# Patient Record
Sex: Female | Born: 1935 | Race: White | Hispanic: No | Smoking: Former smoker
Health system: Southern US, Community
[De-identification: ages and names within clinical notes are randomized; demographics above are authoritative.]

## PROBLEM LIST (undated history)

## (undated) DIAGNOSIS — F0390 Unspecified dementia without behavioral disturbance: Secondary | ICD-10-CM

## (undated) DIAGNOSIS — E079 Disorder of thyroid, unspecified: Secondary | ICD-10-CM

## (undated) DIAGNOSIS — M1990 Unspecified osteoarthritis, unspecified site: Secondary | ICD-10-CM

## (undated) DIAGNOSIS — I10 Essential (primary) hypertension: Secondary | ICD-10-CM

## (undated) DIAGNOSIS — N289 Disorder of kidney and ureter, unspecified: Secondary | ICD-10-CM

## (undated) HISTORY — PX: CYST REMOVAL NECK: SHX6281

## (undated) HISTORY — PX: TOTAL ABDOMINAL HYSTERECTOMY: SHX209

## (undated) HISTORY — PX: CHOLECYSTECTOMY: SHX55

---

## 2010-09-30 ENCOUNTER — Emergency Department (HOSPITAL_BASED_OUTPATIENT_CLINIC_OR_DEPARTMENT_OTHER)
Admission: EM | Admit: 2010-09-30 | Discharge: 2010-09-30 | Disposition: A | Discharge status: Other Acute Inpatient Hospital | Payer: Self-pay | Source: Home / Self Care | Admitting: Emergency Medicine

## 2010-09-30 ENCOUNTER — Inpatient Hospital Stay (HOSPITAL_COMMUNITY)
Admission: EM | Admit: 2010-09-30 | Discharge: 2010-10-03 | Payer: Self-pay | Attending: Internal Medicine | Admitting: Internal Medicine

## 2010-10-05 LAB — CBC
HCT: 38.5 % (ref 36.0–46.0)
HCT: 39.1 % (ref 36.0–46.0)
HCT: 34.7 % — ABNORMAL LOW (ref 36.0–46.0)
Hemoglobin: 13.8 g/dL (ref 12.0–15.0)
Hemoglobin: 13.7 g/dL (ref 12.0–15.0)
Hemoglobin: 12.1 g/dL (ref 12.0–15.0)
MCH: 32 pg (ref 26.0–34.0)
MCH: 32.9 pg (ref 26.0–34.0)
MCH: 32.4 pg (ref 26.0–34.0)
MCHC: 35.8 g/dL (ref 30.0–36.0)
MCHC: 35 g/dL (ref 30.0–36.0)
MCHC: 34.9 g/dL (ref 30.0–36.0)
MCV: 89.3 fL (ref 78.0–100.0)
MCV: 93.8 fL (ref 78.0–100.0)
MCV: 93 fL (ref 78.0–100.0)
Platelets: 226 10*3/uL (ref 150–400)
Platelets: 198 10*3/uL (ref 150–400)
Platelets: 227 10*3/uL (ref 150–400)
RBC: 4.31 MIL/uL (ref 3.87–5.11)
RBC: 4.17 MIL/uL (ref 3.87–5.11)
RBC: 3.73 MIL/uL — ABNORMAL LOW (ref 3.87–5.11)
RDW: 12 % (ref 11.5–15.5)
RDW: 12.7 % (ref 11.5–15.5)
RDW: 12.4 % (ref 11.5–15.5)
WBC: 19.7 10*3/uL — ABNORMAL HIGH (ref 4.0–10.5)
WBC: 20.3 10*3/uL — ABNORMAL HIGH (ref 4.0–10.5)
WBC: 15 10*3/uL — ABNORMAL HIGH (ref 4.0–10.5)

## 2010-10-05 LAB — COMPREHENSIVE METABOLIC PANEL
ALT: 15 U/L (ref 0–35)
ALT: 12 U/L (ref 0–35)
AST: 24 U/L (ref 0–37)
AST: 26 U/L (ref 0–37)
Albumin: 3.9 g/dL (ref 3.5–5.2)
Albumin: 3.2 g/dL — ABNORMAL LOW (ref 3.5–5.2)
Alkaline Phosphatase: 84 U/L (ref 39–117)
Alkaline Phosphatase: 56 U/L (ref 39–117)
BUN: 13 mg/dL (ref 6–23)
BUN: 10 mg/dL (ref 6–23)
CO2: 22 mEq/L (ref 19–32)
CO2: 24 mEq/L (ref 19–32)
Calcium: 9.1 mg/dL (ref 8.4–10.5)
Calcium: 8.4 mg/dL (ref 8.4–10.5)
Chloride: 104 mEq/L (ref 96–112)
Chloride: 103 mEq/L (ref 96–112)
Creatinine, Ser: 0.7 mg/dL (ref 0.4–1.2)
Creatinine, Ser: 0.76 mg/dL (ref 0.4–1.2)
GFR calc Af Amer: >60 mL/min (ref >60)
GFR calc Af Amer: >60 mL/min (ref >60)
GFR calc non Af Amer: >60 mL/min (ref >60)
GFR calc non Af Amer: >60 mL/min (ref >60)
Glucose, Bld: 96 mg/dL (ref 70–99)
Glucose, Bld: 115 mg/dL — ABNORMAL HIGH (ref 70–99)
Potassium: 3.2 mEq/L — ABNORMAL LOW (ref 3.5–5.1)
Potassium: 3.2 mEq/L — ABNORMAL LOW (ref 3.5–5.1)
Sodium: 140 mEq/L (ref 135–145)
Sodium: 134 mEq/L — ABNORMAL LOW (ref 135–145)
Total Bilirubin: 1 mg/dL (ref 0.3–1.2)
Total Bilirubin: 0.7 mg/dL (ref 0.3–1.2)
Total Protein: 7 g/dL (ref 6.0–8.3)
Total Protein: 6.6 g/dL (ref 6.0–8.3)

## 2010-10-05 LAB — URINALYSIS, ROUTINE W REFLEX MICROSCOPIC
Ketones, ur: >80 mg/dL — AB
Leukocytes, UA: NEGATIVE
Nitrite: NEGATIVE
Protein, ur: 30 mg/dL — AB
Specific Gravity, Urine: 1.022 (ref 1.005–1.030)
Urine Glucose, Fasting: NEGATIVE mg/dL
Urobilinogen, UA: 0.2 mg/dL (ref 0.0–1.0)
pH: 6 (ref 5.0–8.0)

## 2010-10-05 LAB — URINE CULTURE
Colony Count: NO GROWTH
Culture  Setup Time: 201201120223
Culture: NO GROWTH

## 2010-10-05 LAB — BASIC METABOLIC PANEL
BUN: 5 mg/dL — ABNORMAL LOW (ref 6–23)
CO2: 23 mEq/L (ref 19–32)
Calcium: 8.2 mg/dL — ABNORMAL LOW (ref 8.4–10.5)
Chloride: 109 mEq/L (ref 96–112)
Creatinine, Ser: 0.65 mg/dL (ref 0.4–1.2)
GFR calc Af Amer: >60 mL/min (ref >60)
GFR calc non Af Amer: >60 mL/min (ref >60)
Glucose, Bld: 126 mg/dL — ABNORMAL HIGH (ref 70–99)
Potassium: 3.5 mEq/L (ref 3.5–5.1)
Sodium: 137 mEq/L (ref 135–145)

## 2010-10-05 LAB — LEGIONELLA ANTIGEN, URINE: Legionella Antigen, Urine: NEGATIVE

## 2010-10-05 LAB — DIFFERENTIAL
Basophils Absolute: 0 10*3/uL (ref 0.0–0.1)
Basophils Relative: 0 % (ref 0–1)
Eosinophils Absolute: 0.2 10*3/uL (ref 0.0–0.7)
Eosinophils Relative: 1 % (ref 0–5)
Lymphocytes Relative: 9 % — ABNORMAL LOW (ref 12–46)
Lymphs Abs: 1.7 10*3/uL (ref 0.7–4.0)
Monocytes Absolute: 1.7 10*3/uL — ABNORMAL HIGH (ref 0.1–1.0)
Monocytes Relative: 9 % (ref 3–12)
Neutro Abs: 16.1 10*3/uL — ABNORMAL HIGH (ref 1.7–7.7)
Neutrophils Relative %: 82 % — ABNORMAL HIGH (ref 43–77)

## 2010-10-05 LAB — PROTIME-INR
INR: 1.39 (ref 0.00–1.49)
Prothrombin Time: 17.3 seconds — ABNORMAL HIGH (ref 11.6–15.2)

## 2010-10-05 LAB — URINE MICROSCOPIC-ADD ON

## 2010-10-07 LAB — CULTURE, BLOOD (ROUTINE X 2)
Culture  Setup Time: 201201112238
Culture: NO GROWTH

## 2010-10-12 LAB — CULTURE, BLOOD (ROUTINE X 2)
Culture  Setup Time: 201201120200
Culture  Setup Time: 201201120321
Culture  Setup Time: 201201120321
Culture: NO GROWTH
Culture: NO GROWTH
Culture: NO GROWTH

## 2010-10-22 NOTE — Discharge Summary (Signed)
NAME:  [name] Davidson, [name] Davidson             ACCOUNT NO.:  [id number]  MEDICAL RECORD NO.:  [id number]          PATIENT TYPE:  INP  LOCATION:  4736                         FACILITY:  [facility]  PHYSICIAN:  Ladell Pier, M.D.   DATE OF BIRTH:  13-Nov-1921  DATE OF ADMISSION:  09/30/2010 DATE OF DISCHARGE:  10/02/2010                              DISCHARGE SUMMARY   DISCHARGE DIAGNOSES: 1. Community-acquired pneumonia. 2. Hypertension. 3. Hypothyroidism. 4. Depression. 5. Dementia.  DISCHARGE MEDICATIONS: 1. Doxycycline 100 mg b.i.d. x10 days. 2. Guaifenesin 5 mL q.4 p.r.n. 3. Amlodipine 5 mg daily. 4. Levothyroxine 50 mcg daily. 5. Lexapro 10 mg daily. 6. Metoprolol 25 mg daily. 7. Mirtazapine 30 mg at bedtime.  FOLLOWUP APPOINTMENTS:  The patient to follow up with Dr. Andrey Campanile in 1week.  PROCEDURES:  Chest x-ray done on January 12th shows increased right lower lobe lung infiltrate consistent with pneumonia.  CONSULTANTS:  None.HISTORY OF PRESENT ILLNESS:  The patient is an 77 year old white female with history of dementia, hypertension, hypothyroidism, and depression came to the hospital with complaints of fever, feeling weak, diarrhea, and nausea for the past 2-3 days.  The patient is a poor historian, but her daughter, [name] Davidson, supplied most of the information.  Her daughter states that the patient has not been feeling well for the past 2 days. She has not been eating and she has been quite lethargic.  Please see admission note for remainder of history, past medical history, family history, social history, meds, allergies, and review of systems per admission H and P.  PHYSICAL EXAMINATION AT TIME TO DISCHARGE:  VITAL SIGNS:  Temperature 99, pulse of 90, respirations 20, blood pressure 143/86, and pulse oximetry 96% on 2 liters and 91% on room air. GENERAL:  The patient is sitting up in chair, well-nourished white female. HEENT:  Normocephalic and atraumatic.  Pupils reactive to  light.  Throat without erythema. CARDIOVASCULAR:  Regular rate and rhythm. LUNGS:  Decreased breath sounds at the bases, especially on the right. ABDOMEN:  Positive bowel sounds. EXTREMITIES:  No edema.  HOSPITAL COURSE: 1. Community-acquired pneumonia:  The patient was admitted to the     hospital, placed on IV Rocephin and Zithromax.  After that, she was     transitioned to doxycycline p.o. as she is allergic to Levaquin,     did not put her on Avelox.  She developed a rash with this     antibiotic.  Discussed with her daughter.  The patient said she     feels fine.  Her breathing is fine.  She does have some pain in the     rib area when she takes a deep breath.  Discussed with them using     Tylenol p.r.n. for the pain.  Continue with the antibiotic and     follow up with PCP.  The patient discharged with home health PT,     OT, and RN. 2. Hypertension:  Blood pressure remained stable throughout     hospitalization. 3. Hypothyroidism.  She was continued on her home thyroid medications.  LABORATORY DATA:  At the time of discharge, urine for Legionella negative.  Blood cultures negative x2.  Sodium 137, potassium 3.5, chloride 109, CO2 of 22, glucose 126, BUN 5, and creatinine 0.65.  WBC is 15, hemoglobin 12.1, platelet of 227, and MCV 93.  Time spent with the patient and doing this discharge is approximately 35 minutes.     Ladell Pier, M.D.     [place]/MEDQ  D:  10/02/2010  T:  10/03/2010  Job:  [id number]  cc:   Dr. Andrey Campanile  Electronically Signed by Ladell Pier M.D. on 10/22/2010 01:37:33 PM

## 2010-10-22 NOTE — Discharge Summary (Signed)
  NAME:  [name] Davidson, [name] Davidson             ACCOUNT NO.:  [id number]  MEDICAL RECORD NO.:  [id number]          PATIENT TYPE:  INP  LOCATION:  4736                         FACILITY:  [facility]  PHYSICIAN:  Ladell Pier, M.D.   DATE OF BIRTH:  [birth year]  DATE OF ADMISSION:  09/30/2010 DATE OF DISCHARGE:  10/03/2010                              DISCHARGE SUMMARY   ADDENDUM  The patient was actually discharged on the 14th.  She was originally discharged on the 13th, but secondary to transportation issues, she was not able to go until the 14th.  The patient actually fell also, the afternoon of the 13th.  They witnessed her fall and she did not hit her head.  She fell on her bottom.  She had no pain, no bruises.  This was discussed with her grandson.  The patient is being discharged today and will follow up with PCP next week, Dr. Andrey Campanile.     Ladell Pier, M.D.     [place]/MEDQ  D:  10/03/2010  T:  10/04/2010  Job:  [id number]  cc:   Dr. Andrey Campanile  Electronically Signed by Ladell Pier M.D. on 10/22/2010 01:37:38 PM

## 2011-04-19 ENCOUNTER — Encounter
Payer: Medicare Other | Attending: Physical Medicine and Rehabilitation | Admitting: Physical Medicine and Rehabilitation

## 2011-04-19 DIAGNOSIS — M549 Dorsalgia, unspecified: Secondary | ICD-10-CM

## 2011-04-19 DIAGNOSIS — M1710 Unilateral primary osteoarthritis, unspecified knee: Secondary | ICD-10-CM

## 2011-04-19 DIAGNOSIS — M810 Age-related osteoporosis without current pathological fracture: Secondary | ICD-10-CM | POA: Insufficient documentation

## 2011-04-19 DIAGNOSIS — M545 Low back pain, unspecified: Secondary | ICD-10-CM | POA: Insufficient documentation

## 2011-04-19 DIAGNOSIS — G894 Chronic pain syndrome: Secondary | ICD-10-CM

## 2011-04-19 DIAGNOSIS — M546 Pain in thoracic spine: Secondary | ICD-10-CM

## 2011-04-19 DIAGNOSIS — M79609 Pain in unspecified limb: Secondary | ICD-10-CM | POA: Insufficient documentation

## 2011-04-20 NOTE — Progress Notes (Signed)
Ms. Kersti Scavone is an 77 year old woman who is accompanied by her daughter Carlynn Purl.  Ms. Archie Patten is in the room with the permission of the patient.  Ms. Mclamb has a history of some memory problems and requests that her daughter help her with her history.  Ms. Zavada has a long history of chronic low back pain as well as bilateral leg pain.  She was referred kindly by Dr. Otelia Sergeant.  She has known rather severe osteoporosis and Alzheimer disease.  Ms. Archie Patten had her mother begin living with her about a year ago.  She has seen multiple physicians for her chronic pain complaints, and she has had compression fractures in the past and has undergone vertebroplasty at L1 at an outside institution.  Her average pain is about 9 on a scale of 10.  Pain is worse during the day, improves at night when she is in bed, although she does spend quite a bit of time in the bed during the day.  Her pain is worse when she is up and trying to walk.  Sleep is fair.  Pain improves somewhat with medication.  She tells me that the hydrocodone and gabapentin take the edge off of the pain somewhat.  She also occasionally feels some pins and needles in her heels when she is lying down.  FUNCTIONAL STATUS:  She can walk not more than 10 minutes.  She does not climb stairs nor drive.  She requires some assistance with bathing.  She is independent with toileting and dressing, feeding, needs help with meal prep and household duties.  She has had some problems with urinary tract infections, otherwise is continent.  She has a bedside commode, and she admits to some weakness in the lower extremities and confusion.  Denies suicidal ideation.  Also review of systems positive for constipation and coughing.  She follows up with primary care doctor for this.  PAST MEDICAL HISTORY:  Positive for a history of thyroid problems, hypertension, kidney disease.  SURGICAL HISTORY:  Positive for hysterectomy, neck cyst, and  the vertebroplasty.  The patient is widowed, lives with her daughter, does not smoke or drink alcohol.  FAMILY HISTORY:  Positive for heart disease, blood pressure, and hypertension.  PHYSICAL EXAMINATION:  Her blood pressure is 149/63, pulse 69, respiration 18, 94% saturated on room air.  She is a frail elderly female who appears her stated age.  She is oriented to person and place and month.  Her affect is overall bright.  She is cooperative, pleasant.  She answers questions and follows commands without difficulty.  Cranial nerves and coordination are grossly intact.  Reflexes in the upper extremities are 1+ and symmetric, diminished in the lower extremities. No abnormal tone, clonus, or tremors are noted.  She has negative Hoffman sign bilaterally.  She reports intact sensation with respect to light touch and pinprick in upper and lower extremities.  Her motor strength is generally in the 4+/5 range without obvious focal deficit with the exception of hip flexors.  She also had some difficulty exiting a chair suggesting some hip extensor weakness.  Her gait is slow.  She has some difficulty with tandem gait, but is able to perform an adequate Romberg test.  She has limitations in cervical range of motion in all planes, some mild shoulder limitations.  Lumbar motion is limited in all planes.  She has bilateral knee pain with flexion/extension.  No joint effusion is appreciated.  Some joint line tenderness is noted bilaterally.  IMPRESSION:  1. Chronic low back pain with history of severe osteoporosis with     known compression fractures status post vertebroplasty at L1 in the     past. 2. History of multiple endplate fractures at L2, L3, and L4. 3. Bilateral knee osteoarthritis.  PLAN:  Reviewing at treatment options, the patient has trialed icy hot patches, Salonpas patches as well as Lidoderm patches.  She finds TENS unit somewhat helpful and is using that.  She finds the  hydrocodone as well as the gabapentin currently helpful.  There is some concern with the family regarding medications and making her drowsier or make her balance worse.  They are not interested in invasive options at this point.  We discussed possibly switching her 7.5 mg hydrocodone tablet to a 5 mg oxycodone tablet.  However, she understands that for these medications to be filled through the pain clinic she would need to be seen every month for pill counts.  They are not sure they want to come every month at this point for pain medications.  They are going to think about this treatment option.  At this point, I have answered all her questions.  They understand that this is a difficult problem to manage. I have suggested some physical therapy to address some hip weakness that was noted on exam today that would assist her in transfers going from sit to stand getting in and out of a car; however, the patient and her family declined this currently.  I will see her back in 4-6 weeks.  They are currently comfortable with current options.     Brantley Stage, M.D. Electronically Signed    DMK/MedQ D:  04/19/2011 14:49:12  T:  04/20/2011 00:38:42  Job #:  [id number]  cc:   Kerrin Champagne, M.D. Fax: [phone]

## 2011-05-28 ENCOUNTER — Ambulatory Visit: Payer: BC Managed Care – PPO | Admitting: Physical Medicine and Rehabilitation

## 2011-09-21 ENCOUNTER — Encounter: Payer: Self-pay | Admitting: Emergency Medicine

## 2011-09-21 ENCOUNTER — Emergency Department (HOSPITAL_BASED_OUTPATIENT_CLINIC_OR_DEPARTMENT_OTHER)
Admission: EM | Admit: 2011-09-21 | Discharge: 2011-09-22 | Disposition: A | Discharge status: Home / Self Care | Payer: Medicare Other | Attending: Emergency Medicine | Admitting: Emergency Medicine

## 2011-09-21 ENCOUNTER — Emergency Department (INDEPENDENT_AMBULATORY_CARE_PROVIDER_SITE_OTHER): Payer: Medicare Other

## 2011-09-21 DIAGNOSIS — M545 Low back pain, unspecified: Secondary | ICD-10-CM

## 2011-09-21 DIAGNOSIS — W06XXXA Fall from bed, initial encounter: Secondary | ICD-10-CM | POA: Insufficient documentation

## 2011-09-21 DIAGNOSIS — R079 Chest pain, unspecified: Secondary | ICD-10-CM

## 2011-09-21 DIAGNOSIS — R4182 Altered mental status, unspecified: Secondary | ICD-10-CM

## 2011-09-21 DIAGNOSIS — W19XXXA Unspecified fall, initial encounter: Secondary | ICD-10-CM

## 2011-09-21 DIAGNOSIS — E079 Disorder of thyroid, unspecified: Secondary | ICD-10-CM | POA: Insufficient documentation

## 2011-09-21 DIAGNOSIS — Y92009 Unspecified place in unspecified non-institutional (private) residence as the place of occurrence of the external cause: Secondary | ICD-10-CM | POA: Insufficient documentation

## 2011-09-21 DIAGNOSIS — M899 Disorder of bone, unspecified: Secondary | ICD-10-CM

## 2011-09-21 DIAGNOSIS — F0390 Unspecified dementia without behavioral disturbance: Secondary | ICD-10-CM | POA: Insufficient documentation

## 2011-09-21 DIAGNOSIS — Z79899 Other long term (current) drug therapy: Secondary | ICD-10-CM | POA: Insufficient documentation

## 2011-09-21 DIAGNOSIS — E860 Dehydration: Secondary | ICD-10-CM

## 2011-09-21 DIAGNOSIS — I10 Essential (primary) hypertension: Secondary | ICD-10-CM | POA: Insufficient documentation

## 2011-09-21 DIAGNOSIS — M949 Disorder of cartilage, unspecified: Secondary | ICD-10-CM

## 2011-09-21 DIAGNOSIS — R319 Hematuria, unspecified: Secondary | ICD-10-CM | POA: Insufficient documentation

## 2011-09-21 HISTORY — DX: Unspecified osteoarthritis, unspecified site: M19.90

## 2011-09-21 HISTORY — DX: Disorder of kidney and ureter, unspecified: N28.9

## 2011-09-21 HISTORY — DX: Essential (primary) hypertension: I10

## 2011-09-21 HISTORY — DX: Disorder of thyroid, unspecified: E07.9

## 2011-09-21 HISTORY — DX: Unspecified dementia, unspecified severity, without behavioral disturbance, psychotic disturbance, mood disturbance, and anxiety: F03.90

## 2011-09-21 LAB — COMPREHENSIVE METABOLIC PANEL
Albumin: 3.7 g/dL (ref 3.5–5.2)
CO2: 25 mEq/L (ref 19–32)
Chloride: 103 mEq/L (ref 96–112)
Creatinine, Ser: 0.6 mg/dL (ref 0.50–1.10)
GFR calc Af Amer: >90 mL/min (ref >90)
Potassium: 3.6 mEq/L (ref 3.5–5.1)

## 2011-09-21 LAB — DIFFERENTIAL
Basophils Absolute: 0 10*3/uL (ref 0.0–0.1)
Lymphocytes Relative: 23 % (ref 12–46)
Monocytes Absolute: 1 10*3/uL (ref 0.1–1.0)
Monocytes Relative: 15 % — ABNORMAL HIGH (ref 3–12)
Neutro Abs: 4.2 10*3/uL (ref 1.7–7.7)

## 2011-09-21 LAB — URINALYSIS, ROUTINE W REFLEX MICROSCOPIC
Bilirubin Urine: NEGATIVE
Nitrite: NEGATIVE
Specific Gravity, Urine: 1.021 (ref 1.005–1.030)
Urobilinogen, UA: 0.2 mg/dL (ref 0.0–1.0)
pH: 6 (ref 5.0–8.0)

## 2011-09-21 LAB — CBC
HCT: 41.5 % (ref 36.0–46.0)
Hemoglobin: 14.2 g/dL (ref 12.0–15.0)
RBC: 4.48 MIL/uL (ref 3.87–5.11)
WBC: 6.7 10*3/uL (ref 4.0–10.5)

## 2011-09-21 LAB — URINE MICROSCOPIC-ADD ON

## 2011-09-21 MED ORDER — SODIUM CHLORIDE 0.9 % IV BOLUS (SEPSIS)
1000.0000 mL | Freq: Once | INTRAVENOUS | Status: AC
  Administered 2011-09-22: 1000 mL via INTRAVENOUS

## 2011-09-21 NOTE — ED Provider Notes (Signed)
History     CSN: [id number]  Arrival date & time 09/21/11  1754   First MD Initiated Contact with Patient 09/21/11 1901      Chief Complaint  Patient presents with  . Flank Pain    (Consider location/radiation/quality/duration/timing/severity/associated sxs/prior treatment) HPI Comments: Patient is an 78 year old female with a history of hypertension, renal insufficiency, dementia who presents after a fall. She is currently living with her daughter and as she was trying to transition from the bed to the bedside commode her right knee which is known to be arthritic felt weak and she fell to the ground onto her bottom. This was acute in onset just prior to arrival and because the patient was unable to get up because of pain the family member called for EMS transport to the hospital. Currently the patient complains of no pain unless she is moving. When she moves she describes pain in her lower back and her right side in the lower back. She denies head injury, neck pain, extremity pain  Patient is a 78 y.o. female presenting with flank pain. The history is provided by the patient and a relative. The history is limited by the condition of the patient.  Flank Pain    Past Medical History  Diagnosis Date  . Dementia   . Osteoarthritis   . Hypertension   . Thyroid disease   . Renal disorder     No past surgical history on file.  No family history on file.  History  Substance Use Topics  . Smoking status: Not on file  . Smokeless tobacco: Not on file  . Alcohol Use: No    OB History    Grav Para Term Preterm Abortions TAB SAB Ect Mult Living                  Review of Systems  Unable to perform ROS: Dementia  Genitourinary: Positive for flank pain.    Allergies  Contrast media and Sulfa drugs cross reactors  Home Medications   Current Outpatient Rx  Name Route Sig Dispense Refill  . AMLODIPINE BESYLATE 5 MG PO TABS Oral Take 5 mg by mouth daily.      . ASPIRIN PO  Oral Take 1 tablet by mouth daily.      Marland Kitchen LEVOTHYROXINE SODIUM 50 MCG PO TABS Oral Take 50 mcg by mouth daily.      Marland Kitchen SYNTHROID PO Oral Take 1 tablet by mouth daily.      Marland Kitchen MEMANTINE HCL 10 MG PO TABS Oral Take 10 mg by mouth 2 (two) times daily.      Marland Kitchen METHOCARBAMOL 500 MG PO TABS Oral Take 500 mg by mouth every 6 (six) hours.      Marland Kitchen METOPROLOL SUCCINATE ER 25 MG PO TB24 Oral Take 25 mg by mouth daily.      Marland Kitchen MIRTAZAPINE 30 MG PO TABS Oral Take 30 mg by mouth at bedtime.      Marland Kitchen OVER THE COUNTER MEDICATION Oral Take 1 tablet by mouth daily. Protandim supplement     . NAPROXEN 500 MG PO TABS Oral Take 1 tablet (500 mg total) by mouth 2 (two) times daily with a meal. 30 tablet 0  . TRAMADOL HCL 50 MG PO TABS Oral Take 1 tablet (50 mg total) by mouth every 6 (six) hours as needed for pain. Maximum dose= 8 tablets per day 15 tablet 0    BP 144/73  Pulse 77  Temp(Src) 98.2 F (36.8 C) (  Oral)  Resp 20  SpO2 93%  Physical Exam  Nursing note and vitals reviewed. Constitutional: She appears well-developed and well-nourished. No distress.  HENT:  Head: Normocephalic and atraumatic.  Mouth/Throat: Oropharynx is clear and moist. No oropharyngeal exudate.  Eyes: Conjunctivae and EOM are normal. Pupils are equal, round, and reactive to light. Right eye exhibits no discharge. Left eye exhibits no discharge. No scleral icterus.  Neck: Normal range of motion. Neck supple. No JVD present. No thyromegaly present.  Cardiovascular: Normal rate, regular rhythm, normal heart sounds and intact distal pulses.  Exam reveals no gallop and no friction rub.   No murmur heard. Pulmonary/Chest: Effort normal and breath sounds normal. No respiratory distress. She has no wheezes. She has no rales.  Abdominal: Soft. Bowel sounds are normal. She exhibits no distension and no mass. There is no tenderness.  Musculoskeletal: Normal range of motion. She exhibits tenderness ( Tenderness to the lower back, right lower back,  no spinal tenderness, no pain in the hip with range of motion or palpation, full range of motion of bilateral hips without pain). She exhibits no edema.  Lymphadenopathy:    She has no cervical adenopathy.  Neurological: She is alert. Coordination normal.       Able to lift both legs off the bed without difficulty, can spontaneously but at the hips and the knees without difficulty  Skin: Skin is warm and dry. No rash noted. No erythema.  Psychiatric: She has a normal mood and affect. Her behavior is normal.    ED Course  Procedures (including critical care time)  Labs Reviewed  URINALYSIS, ROUTINE W REFLEX MICROSCOPIC - Abnormal; Notable for the following:    Hgb urine dipstick LARGE (*)    Ketones, ur >80 (*)    Protein, ur 30 (*)    All other components within normal limits  URINE MICROSCOPIC-ADD ON - Abnormal; Notable for the following:    Squamous Epithelial / LPF FEW (*)    All other components within normal limits  COMPREHENSIVE METABOLIC PANEL - Abnormal; Notable for the following:    GFR calc non Af Amer 80 (*)    All other components within normal limits  DIFFERENTIAL - Abnormal; Notable for the following:    Monocytes Relative 15 (*)    All other components within normal limits  CBC  URINE CULTURE   Dg Ribs Unilateral W/chest Right  09/21/2011  *RADIOLOGY REPORT*  Clinical Data: Fall with low back and right-sided rib pain.  RIGHT RIBS AND CHEST - 3+ VIEW  Comparison: 07/12/2011 [facility] of [facility].  Findings: Frontal view of the chest demonstrates moderate osteopenia.  Thoracolumbar vertebral augmentation. Midline trachea. Normal heart size and mediastinal contours for age.  No pleural effusion or pneumothorax.  Scarring at the right lung base.  3 views of right-sided ribs.  No convincing evidence of acute rib fracture.  Suspect lower posterolateral remote right rib fractures.  IMPRESSION: Osteopenia with suspicion of remote right rib trauma.  No convincing evidence  of acute trauma and no evidence of pneumothorax.  Original Report Authenticated By: Consuello Bossier, M.D.   Dg Lumbar Spine Complete  09/21/2011  *RADIOLOGY REPORT*  Clinical Data: Low back pain.  Pain.  Demented.  LUMBAR SPINE - COMPLETE 4+ VIEW  Comparison: MRI 04/17/2010 and plain film of 03/04/2010.  Findings: Moderate to marked osteopenia.  Prior T11 and L1 vertebral augmentation.  S-shaped thoracolumbar spine curvature.  Mild L4 vertebral body height loss is not significantly changed. No  canal compromise.  Spondylosis.  Aortic atherosclerosis.  IMPRESSION:  1.  No acute osseous abnormality. 2.  T11, L1, and L4 compression deformities are chronic.  Status post augmentation at T11 and L1. 3.  Aortic atherosclerosis.  Original Report Authenticated By: Consuello Bossier, M.D.     1. Lower back pain   2. Fall   3. Dehydration   4. Hematuria       MDM  No overt signs of external trauma, no bruising or lacerations. Mild reproducible tenderness to the right lower back and lower right rib. Rib x-rays and lumbar spine series ordered.  Patient given IV rehydration, family requesting to take the patient home at this time. Blood work reveals no other significant abnormalities. Patient informed of all results including hematuria and dehydration. Also informed of negative x-rays for acute fracture       Vida Roller, MD 09/22/11 [phone]

## 2011-09-22 MED ORDER — TRAMADOL HCL 50 MG PO TABS
50.0000 mg | ORAL_TABLET | Freq: Four times a day (QID) | ORAL | Status: AC | PRN
Start: 2011-09-22 — End: 2011-10-02

## 2011-09-22 MED ORDER — NAPROXEN 500 MG PO TABS: 500.0000 mg | ORAL_TABLET | Freq: Two times a day (BID) | ORAL | Status: DC

## 2011-09-23 LAB — URINE CULTURE: Culture  Setup Time: 201301020406

## 2012-06-25 ENCOUNTER — Emergency Department (HOSPITAL_BASED_OUTPATIENT_CLINIC_OR_DEPARTMENT_OTHER): Payer: Medicare Other

## 2012-06-25 ENCOUNTER — Inpatient Hospital Stay (HOSPITAL_BASED_OUTPATIENT_CLINIC_OR_DEPARTMENT_OTHER)
Admission: EM | Admit: 2012-06-25 | Discharge: 2012-06-30 | DRG: 193 | Disposition: A | Discharge status: Home Health | Payer: Medicare Other | Attending: Internal Medicine | Admitting: Internal Medicine

## 2012-06-25 ENCOUNTER — Encounter (HOSPITAL_BASED_OUTPATIENT_CLINIC_OR_DEPARTMENT_OTHER): Payer: Self-pay | Admitting: *Deleted

## 2012-06-25 DIAGNOSIS — R6889 Other general symptoms and signs: Secondary | ICD-10-CM

## 2012-06-25 DIAGNOSIS — J9811 Atelectasis: Secondary | ICD-10-CM

## 2012-06-25 DIAGNOSIS — E039 Hypothyroidism, unspecified: Secondary | ICD-10-CM

## 2012-06-25 DIAGNOSIS — Z79899 Other long term (current) drug therapy: Secondary | ICD-10-CM

## 2012-06-25 DIAGNOSIS — J9601 Acute respiratory failure with hypoxia: Secondary | ICD-10-CM

## 2012-06-25 DIAGNOSIS — J9819 Other pulmonary collapse: Secondary | ICD-10-CM | POA: Diagnosis present

## 2012-06-25 DIAGNOSIS — E860 Dehydration: Secondary | ICD-10-CM

## 2012-06-25 DIAGNOSIS — F0390 Unspecified dementia without behavioral disturbance: Secondary | ICD-10-CM

## 2012-06-25 DIAGNOSIS — Z87891 Personal history of nicotine dependence: Secondary | ICD-10-CM

## 2012-06-25 DIAGNOSIS — J9600 Acute respiratory failure, unspecified whether with hypoxia or hypercapnia: Secondary | ICD-10-CM

## 2012-06-25 DIAGNOSIS — Z66 Do not resuscitate: Secondary | ICD-10-CM | POA: Diagnosis not present

## 2012-06-25 DIAGNOSIS — J189 Pneumonia, unspecified organism: Principal | ICD-10-CM

## 2012-06-25 DIAGNOSIS — Z720 Tobacco use: Secondary | ICD-10-CM

## 2012-06-25 DIAGNOSIS — R0902 Hypoxemia: Secondary | ICD-10-CM

## 2012-06-25 DIAGNOSIS — I10 Essential (primary) hypertension: Secondary | ICD-10-CM

## 2012-06-25 DIAGNOSIS — D72829 Elevated white blood cell count, unspecified: Secondary | ICD-10-CM

## 2012-06-25 DIAGNOSIS — M1990 Unspecified osteoarthritis, unspecified site: Secondary | ICD-10-CM | POA: Diagnosis present

## 2012-06-25 LAB — COMPREHENSIVE METABOLIC PANEL
Albumin: 4.1 g/dL (ref 3.5–5.2)
BUN: 13 mg/dL (ref 6–23)
Chloride: 100 mEq/L (ref 96–112)
Creatinine, Ser: 0.7 mg/dL (ref 0.50–1.10)
GFR calc Af Amer: 87 mL/min — ABNORMAL LOW (ref >90)
GFR calc non Af Amer: 75 mL/min — ABNORMAL LOW (ref >90)
Glucose, Bld: 185 mg/dL — ABNORMAL HIGH (ref 70–99)
Total Bilirubin: 0.4 mg/dL (ref 0.3–1.2)

## 2012-06-25 LAB — BLOOD GAS, ARTERIAL
Bicarbonate: 26.3 mEq/L — ABNORMAL HIGH (ref 20.0–24.0)
Patient temperature: 98.6
pH, Arterial: 7.406 (ref 7.350–7.450)
pO2, Arterial: 191 mmHg — ABNORMAL HIGH (ref 80.0–100.0)

## 2012-06-25 LAB — URINALYSIS, ROUTINE W REFLEX MICROSCOPIC
Glucose, UA: NEGATIVE mg/dL
Ketones, ur: 15 mg/dL — AB
Leukocytes, UA: NEGATIVE
Nitrite: NEGATIVE
Protein, ur: 30 mg/dL — AB
Urobilinogen, UA: 0.2 mg/dL (ref 0.0–1.0)

## 2012-06-25 LAB — CBC WITH DIFFERENTIAL/PLATELET
Basophils Relative: 0 % (ref 0–1)
HCT: 47.5 % — ABNORMAL HIGH (ref 36.0–46.0)
Hemoglobin: 16.2 g/dL — ABNORMAL HIGH (ref 12.0–15.0)
Lymphs Abs: 2 10*3/uL (ref 0.7–4.0)
MCH: 31.9 pg (ref 26.0–34.0)
MCHC: 34.1 g/dL (ref 30.0–36.0)
Monocytes Absolute: 0.8 10*3/uL (ref 0.1–1.0)
Monocytes Relative: 6 % (ref 3–12)
Neutro Abs: 8.6 10*3/uL — ABNORMAL HIGH (ref 1.7–7.7)

## 2012-06-25 LAB — LACTIC ACID, PLASMA: Lactic Acid, Venous: 1.7 mmol/L (ref 0.5–2.2)

## 2012-06-25 MED ORDER — ACETAMINOPHEN 325 MG PO TABS: 650.0000 mg | ORAL_TABLET | Freq: Four times a day (QID) | ORAL | Status: DC | PRN

## 2012-06-25 MED ORDER — SODIUM CHLORIDE 0.9 % IJ SOLN
3.0000 mL | Freq: Two times a day (BID) | INTRAMUSCULAR | Status: DC
  Administered 2012-06-25: 3 mL via INTRAVENOUS

## 2012-06-25 MED ORDER — METHOCARBAMOL 500 MG PO TABS
500.0000 mg | ORAL_TABLET | Freq: Four times a day (QID) | ORAL | Status: DC | PRN
  Filled 2012-06-25: qty 1

## 2012-06-25 MED ORDER — ALBUTEROL SULFATE (5 MG/ML) 0.5% IN NEBU
2.5000 mg | INHALATION_SOLUTION | Freq: Four times a day (QID) | RESPIRATORY_TRACT | Status: DC
  Administered 2012-06-25 – 2012-06-30 (×16): 2.5 mg via RESPIRATORY_TRACT
  Filled 2012-06-25 (×19): qty 0.5

## 2012-06-25 MED ORDER — AMLODIPINE BESYLATE 5 MG PO TABS
5.0000 mg | ORAL_TABLET | Freq: Every day | ORAL | Status: DC
  Administered 2012-06-27 – 2012-06-30 (×4): 5 mg via ORAL
  Filled 2012-06-25 (×5): qty 1

## 2012-06-25 MED ORDER — DEXTROSE 5 % IV SOLN: 1.0000 g | INTRAVENOUS | Status: DC

## 2012-06-25 MED ORDER — ENOXAPARIN SODIUM 40 MG/0.4ML ~~LOC~~ SOLN
40.0000 mg | SUBCUTANEOUS | Status: DC
  Administered 2012-06-25 – 2012-06-29 (×5): 40 mg via SUBCUTANEOUS
  Filled 2012-06-25 (×6): qty 0.4

## 2012-06-25 MED ORDER — DEXTROSE 5 % IV SOLN
500.0000 mg | Freq: Once | INTRAVENOUS | Status: AC
  Administered 2012-06-25: 500 mg via INTRAVENOUS
  Filled 2012-06-25: qty 500

## 2012-06-25 MED ORDER — HYDROCODONE-ACETAMINOPHEN 10-325 MG PO TABS
1.0000 | ORAL_TABLET | Freq: Four times a day (QID) | ORAL | Status: DC | PRN
  Administered 2012-06-25: 1 via ORAL
  Filled 2012-06-25: qty 1

## 2012-06-25 MED ORDER — IPRATROPIUM BROMIDE 0.02 % IN SOLN
0.5000 mg | Freq: Four times a day (QID) | RESPIRATORY_TRACT | Status: DC
  Administered 2012-06-25 – 2012-06-30 (×16): 0.5 mg via RESPIRATORY_TRACT
  Filled 2012-06-25 (×18): qty 2.5

## 2012-06-25 MED ORDER — ACETAMINOPHEN 650 MG RE SUPP: 650.0000 mg | Freq: Four times a day (QID) | RECTAL | Status: DC | PRN

## 2012-06-25 MED ORDER — DEXTROSE 5 % IV SOLN
1.0000 g | INTRAVENOUS | Status: DC
  Administered 2012-06-25 – 2012-06-30 (×6): 1 g via INTRAVENOUS
  Filled 2012-06-25 (×6): qty 10

## 2012-06-25 MED ORDER — ALBUTEROL SULFATE (5 MG/ML) 0.5% IN NEBU: 2.5000 mg | INHALATION_SOLUTION | RESPIRATORY_TRACT | Status: DC | PRN

## 2012-06-25 MED ORDER — MEMANTINE HCL 10 MG PO TABS
10.0000 mg | ORAL_TABLET | Freq: Two times a day (BID) | ORAL | Status: DC
  Administered 2012-06-25 – 2012-06-30 (×9): 10 mg via ORAL
  Filled 2012-06-25 (×11): qty 1

## 2012-06-25 MED ORDER — LEVOTHYROXINE SODIUM 50 MCG PO TABS
50.0000 ug | ORAL_TABLET | Freq: Every day | ORAL | Status: DC
  Administered 2012-06-27 – 2012-06-30 (×4): 50 ug via ORAL
  Filled 2012-06-25 (×7): qty 1

## 2012-06-25 MED ORDER — GABAPENTIN 300 MG PO CAPS
300.0000 mg | ORAL_CAPSULE | Freq: Three times a day (TID) | ORAL | Status: DC
  Administered 2012-06-25 – 2012-06-30 (×13): 300 mg via ORAL
  Filled 2012-06-25 (×16): qty 1

## 2012-06-25 MED ORDER — SODIUM CHLORIDE 0.9 % IJ SOLN
3.0000 mL | Freq: Two times a day (BID) | INTRAMUSCULAR | Status: DC
  Administered 2012-06-25 – 2012-06-29 (×5): 3 mL via INTRAVENOUS

## 2012-06-25 MED ORDER — BUDESONIDE 0.5 MG/2ML IN SUSP
0.5000 mg | Freq: Two times a day (BID) | RESPIRATORY_TRACT | Status: DC
  Administered 2012-06-25 – 2012-06-30 (×8): 0.5 mg via RESPIRATORY_TRACT
  Filled 2012-06-25 (×13): qty 2

## 2012-06-25 MED ORDER — AZITHROMYCIN 500 MG IV SOLR
500.0000 mg | INTRAVENOUS | Status: DC
  Administered 2012-06-26: 500 mg via INTRAVENOUS
  Filled 2012-06-25 (×2): qty 500

## 2012-06-25 MED ORDER — METOPROLOL SUCCINATE ER 25 MG PO TB24
25.0000 mg | ORAL_TABLET | Freq: Every day | ORAL | Status: DC
  Administered 2012-06-27 – 2012-06-30 (×4): 25 mg via ORAL
  Filled 2012-06-25 (×5): qty 1

## 2012-06-25 MED ORDER — ONDANSETRON HCL 4 MG/2ML IJ SOLN: 4.0000 mg | Freq: Four times a day (QID) | INTRAMUSCULAR | Status: DC | PRN

## 2012-06-25 MED ORDER — MIRTAZAPINE 30 MG PO TABS
30.0000 mg | ORAL_TABLET | Freq: Every day | ORAL | Status: DC
  Administered 2012-06-25 – 2012-06-29 (×5): 30 mg via ORAL
  Filled 2012-06-25 (×6): qty 1

## 2012-06-25 MED ORDER — ONDANSETRON HCL 4 MG/2ML IJ SOLN: 4.0000 mg | Freq: Three times a day (TID) | INTRAMUSCULAR | Status: DC | PRN

## 2012-06-25 MED ORDER — ONDANSETRON HCL 4 MG PO TABS: 4.0000 mg | ORAL_TABLET | Freq: Four times a day (QID) | ORAL | Status: DC | PRN

## 2012-06-25 NOTE — ED Notes (Signed)
They have been beeped twice and [facility] will beep them again.

## 2012-06-25 NOTE — Progress Notes (Signed)
Patient accecpted to SDU from [facility].  Dx: CAPNA and hypoxia- responded to NRB sats went from 80s to 94%, HR stable, BP stable.  Given abx.  Patient had PNA in similar position last year-- ?CT scan to r/o malignancy? Mild leukocytosis No fever +cough PCP: [facility] Full code

## 2012-06-25 NOTE — ED Notes (Signed)
Pt has hx of lung inf x 2. Woke up this a.m in cold sweat. Also being treated for kidney infection. Sats 80% at triage. Taken to [facility]. Brett Canales, RRT to bedside to assess.

## 2012-06-25 NOTE — ED Provider Notes (Signed)
History   This chart was scribed for [name] Booze, MD by Shari Heritage. The patient was seen in room MH11/MH11. Patient's care was started at 1530.    CSN: [id number]  Arrival date & time 06/25/12  1514   First MD Initiated Contact with Patient 06/25/12 1530      Chief Complaint  Patient presents with  . Shortness of Breath     The history is provided by the patient and a relative. No language interpreter was used.   [name] Davidson is a 78 y.o. female who presents to the Emergency Department complaining of moderate to severe, persistent shortness of breath onset several hours ago. Patient has also had a raspy cough for the last 4-5 days. She has been taking Zithromycin for treatment of cough. No fever, chills or major pain. Patient states that O2 helps patient breath better. Patient has a past history of dementia, osteoarthritis, hypertension, thyroid disease, and renal disorder. No smoking or drinking.     Past Medical History  Diagnosis Date  . Dementia   . Osteoarthritis   . Hypertension   . Thyroid disease   . Renal disorder     History reviewed. No pertinent past surgical history.  History reviewed. No pertinent family history.  History  Substance Use Topics  . Smoking status: Not on file  . Smokeless tobacco: Not on file  . Alcohol Use: No    OB History    Grav Para Term Preterm Abortions TAB SAB Ect Mult Living                  Review of Systems  Constitutional: Negative for fever and chills.  Respiratory: Positive for cough and shortness of breath.   Musculoskeletal: Negative for myalgias and arthralgias.  All other systems reviewed and are negative.    Allergies  Contrast media and Sulfa drugs cross reactors  Home Medications   Current Outpatient Rx  Name Route Sig Dispense Refill  . GABAPENTIN 300 MG PO CAPS Oral Take 300 mg by mouth 3 (three) times daily.    Marland Kitchen HYDROCODONE-ACETAMINOPHEN 10-325 MG PO TABS Oral Take 1 tablet by mouth every 6  (six) hours as needed.    Marland Kitchen AMLODIPINE BESYLATE 5 MG PO TABS Oral Take 5 mg by mouth daily.      . ASPIRIN PO Oral Take 1 tablet by mouth daily.      Marland Kitchen LEVOTHYROXINE SODIUM 50 MCG PO TABS Oral Take 50 mcg by mouth daily.      Marland Kitchen SYNTHROID PO Oral Take 1 tablet by mouth daily.      Marland Kitchen MEMANTINE HCL 10 MG PO TABS Oral Take 10 mg by mouth 2 (two) times daily.      Marland Kitchen METHOCARBAMOL 500 MG PO TABS Oral Take 500 mg by mouth every 6 (six) hours.      Marland Kitchen METOPROLOL SUCCINATE ER 25 MG PO TB24 Oral Take 25 mg by mouth daily.      Marland Kitchen MIRTAZAPINE 30 MG PO TABS Oral Take 30 mg by mouth at bedtime.      Marland Kitchen NAPROXEN 500 MG PO TABS Oral Take 1 tablet (500 mg total) by mouth 2 (two) times daily with a meal. 30 tablet 0  . OVER THE COUNTER MEDICATION Oral Take 1 tablet by mouth daily. Protandim supplement       BP 140/74  Pulse 115  Temp 97.6 F (36.4 C) (Oral)  Resp 28  Ht 5\' 4"  (1.626 m)  Wt 109 lb (49.442  kg)  BMI 18.71 kg/m2  SpO2 80%  Physical Exam  Constitutional: She appears well-developed and well-nourished.       Mild respiratory distress. Slight use of accessory muscles with respiration.  HENT:  Head: Normocephalic and atraumatic.  Cardiovascular: Regular rhythm.  Tachycardia present.   No murmur heard. Pulmonary/Chest: She is in respiratory distress. She has decreased breath sounds.       Markedly decrease both sounds in the right lung  Abdominal: Soft. Bowel sounds are normal. She exhibits no distension. There is no tenderness. There is no rebound.  Musculoskeletal: Normal range of motion.  Neurological: She is alert.  Skin: Skin is warm and dry.  Psychiatric: She has a normal mood and affect. Her behavior is normal.    ED Course  Procedures (including critical care time) DIAGNOSTIC STUDIES: Oxygen Saturation is 80% on room air, low by my interpretation.    COORDINATION OF CARE: 3:41 pm- Patient informed of current plan for treatment and evaluation and agrees with plan at this time.     4:45pm- Consult with hospitalist. Patient to be admitted to [facility].  Results for orders placed during the hospital encounter of 06/25/12  CBC WITH DIFFERENTIAL      Component Value Range   WBC 12.4 (*) 4.0 - 10.5 K/uL   RBC 5.08  3.87 - 5.11 MIL/uL   Hemoglobin 16.2 (*) 12.0 - 15.0 g/dL   HCT 16.1 (*) 09.6 - 04.5 %   MCV 93.5  78.0 - 100.0 fL   MCH 31.9  26.0 - 34.0 pg   MCHC 34.1  30.0 - 36.0 g/dL   RDW 40.9  81.1 - 91.4 %   Platelets 244  150 - 400 K/uL   Neutrophils Relative 69  43 - 77 %   Neutro Abs 8.6 (*) 1.7 - 7.7 K/uL   Lymphocytes Relative 16  12 - 46 %   Lymphs Abs 2.0  0.7 - 4.0 K/uL   Monocytes Relative 6  3 - 12 %   Monocytes Absolute 0.8  0.1 - 1.0 K/uL   Eosinophils Relative 8 (*) 0 - 5 %   Eosinophils Absolute 1.0 (*) 0.0 - 0.7 K/uL   Basophils Relative 0  0 - 1 %   Basophils Absolute 0.1  0.0 - 0.1 K/uL     Dg Chest Portable 1 View  06/25/2012  *RADIOLOGY REPORT*  Clinical Data: Shortness of breath.  History of lung infection. Cold sweats.  History hypertension.  Kidney infection.  PORTABLE CHEST - 1 VIEW  Comparison: 09/21/2011  Findings: The heart is enlarged.  Aorta is tortuous.  There is patchy density within the right lower lobe, most consistent with infectious infiltrate and atelectasis.  Elevation of the right hemidiaphragm is consistent with some volume loss.  There are mildly prominent interstitial markings.  Otherwise, the left lung is clear.  Patient has had previous vertebral plasty at multiple levels.  IMPRESSION:  1.  Cardiomegaly. 2.  Right lower lobe atelectasis and infiltrate.   Original Report Authenticated By: Patterson Hammersmith, M.D.      Date: 06/25/2012  Rate: 103  Rhythm: sinus tachycardia  QRS Axis: left  Intervals: normal  ST/T Wave abnormalities: nonspecific ST/T changes  Conduction Disutrbances:none  Narrative Interpretation: Low voltage, left axis deviation, probable right ventricular hypertrophy, nonspecific ST and T changes. When  compared with ECG of 10/01/2010, no significant changes are seen.  Old EKG Reviewed: unchanged    1. Community acquired pneumonia   2. Hypoxia  CRITICAL CARE Performed by: [name] Davidson   Total critical care time: 45 minutes  Critical care time was exclusive of separately billable procedures and treating other patients.  Critical care was necessary to treat or prevent imminent or life-threatening deterioration.  Critical care was time spent personally by me on the following activities: development of treatment plan with patient and/or surrogate as well as nursing, discussions with consultants, evaluation of patient's response to treatment, examination of patient, obtaining history from patient or surrogate, ordering and performing treatments and interventions, ordering and review of laboratory studies, ordering and review of radiographic studies, pulse oximetry and re-evaluation of patient's condition.    MDM  Probable pneumonia with hypoxia. Even with a 100% oxygen, and she is only maintaining saturations of 90-91%. No rales are heard on physical exam, but chest x-ray shows evidence of a right lower lobe infiltrate and volume loss. This is the same location that she had pneumonia no prior chest x-ray. There is volume loss in this area and she may need evaluation for possible mass that might predispose her to infection in that area. She will be started on antibiotics for community-acquired pneumonia and she's given a dose of Rocephin and Zithromax. I discussed CODE STATUS with the patient's daughter who states that she wishes the patient to be full code. Prior records are reviewed, and she was admitted for community-acquired pneumonia in January of 2012.  Oxygen saturation has improved to 94%, but she quickly desaturates with even minimal exertion. In spite of this, she is resting comfortably with only minimal use of accessory muscles and is in no distress. Because of hypoxia, and she is  admitted to the step down unit. Case is discussed with Dr. Benjamine Mola of [facility] who agrees to admit the patient.    I personally performed the services described in this documentation, which was scribed in my presence. The recorded information has been reviewed and considered.      [name] Booze, MD 06/25/12 [phone]

## 2012-06-25 NOTE — ED Notes (Signed)
Called [facility] with bed 3306 at [facility].

## 2012-06-25 NOTE — H&P (Signed)
[name] Davidson is an 78 y.o. female.   Patient seen and examined on June 25, 2012. PCP - Dr. Benedetto Goad. History obtained from patient's daughter. Chief Complaint: Shortness of breath. HPI: 78 year-old female with history of hypertension, hypothyroidism, dementia who lives with her daughter was found to be getting very short of breath since morning today. She was brought to the ER at [facility]. Over there chest x-ray revealed infiltrates and patient has been started IV antibiotics for community-acquired pneumonia. Patient is not in respiratory distress but requires nonrebreather for maintaining sats thus patient is admitted to step down unit. Patient denies any chest pain nausea vomiting abdominal pain. As per patient's daughter patient has been experiencing some productive cough a month ago and had been placed on course of Zithromax by the PCP. After taking the whole course patient again started experiencing productive cough 2 weeks later for which another course of antibiotics were given. 2-3 days ago patient started having again cough and not doing well. Today patient became short of breath.  Past Medical History  Diagnosis Date  . Dementia   . Osteoarthritis   . Hypertension   . Thyroid disease   . Renal disorder     History reviewed. No pertinent past surgical history.  History reviewed. No pertinent family history. Social History:  reports that she has quit smoking. She does not have any smokeless tobacco history on file. She reports that she does not drink alcohol or use illicit drugs.  Allergies:  Allergies  Allergen Reactions  . Contrast Media (Iodinated Diagnostic Agents) Other (See Comments)    unknown  . Sulfa Drugs Cross Reactors Other (See Comments)    unknown    Medications Prior to Admission  Medication Sig Dispense Refill  . gabapentin (NEURONTIN) 300 MG capsule Take 300 mg by mouth 3 (three) times daily.      Marland Kitchen HYDROcodone-acetaminophen (NORCO) 10-325  MG per tablet Take 1 tablet by mouth every 6 (six) hours as needed.      Marland Kitchen amLODipine (NORVASC) 5 MG tablet Take 5 mg by mouth daily.        . ASPIRIN PO Take 1 tablet by mouth daily.        Marland Kitchen levothyroxine (SYNTHROID, LEVOTHROID) 50 MCG tablet Take 50 mcg by mouth daily.        . Levothyroxine Sodium (SYNTHROID PO) Take 1 tablet by mouth daily.        . memantine (NAMENDA) 10 MG tablet Take 10 mg by mouth 2 (two) times daily.        . methocarbamol (ROBAXIN) 500 MG tablet Take 500 mg by mouth every 6 (six) hours.        . metoprolol succinate (TOPROL-XL) 25 MG 24 hr tablet Take 25 mg by mouth daily.        . mirtazapine (REMERON) 30 MG tablet Take 30 mg by mouth at bedtime.        . naproxen (NAPROSYN) 500 MG tablet Take 1 tablet (500 mg total) by mouth 2 (two) times daily with a meal.  30 tablet  0  . OVER THE COUNTER MEDICATION Take 1 tablet by mouth daily. Protandim supplement         Results for orders placed during the hospital encounter of 06/25/12 (from the past 48 hour(s))  CBC WITH DIFFERENTIAL     Status: Abnormal   Collection Time   06/25/12  3:30 PM      Component Value Range Comment  WBC 12.4 (*) 4.0 - 10.5 K/uL    RBC 5.08  3.87 - 5.11 MIL/uL    Hemoglobin 16.2 (*) 12.0 - 15.0 g/dL    HCT 40.9 (*) 81.1 - 46.0 %    MCV 93.5  78.0 - 100.0 fL    MCH 31.9  26.0 - 34.0 pg    MCHC 34.1  30.0 - 36.0 g/dL    RDW 91.4  78.2 - 95.6 %    Platelets 244  150 - 400 K/uL    Neutrophils Relative 69  43 - 77 %    Neutro Abs 8.6 (*) 1.7 - 7.7 K/uL    Lymphocytes Relative 16  12 - 46 %    Lymphs Abs 2.0  0.7 - 4.0 K/uL    Monocytes Relative 6  3 - 12 %    Monocytes Absolute 0.8  0.1 - 1.0 K/uL    Eosinophils Relative 8 (*) 0 - 5 %    Eosinophils Absolute 1.0 (*) 0.0 - 0.7 K/uL    Basophils Relative 0  0 - 1 %    Basophils Absolute 0.1  0.0 - 0.1 K/uL   COMPREHENSIVE METABOLIC PANEL     Status: Abnormal   Collection Time   06/25/12  3:30 PM      Component Value Range Comment    Sodium 140  135 - 145 mEq/L    Potassium 3.4 (*) 3.5 - 5.1 mEq/L    Chloride 100  96 - 112 mEq/L    CO2 26  19 - 32 mEq/L    Glucose, Bld 185 (*) 70 - 99 mg/dL    BUN 13  6 - 23 mg/dL    Creatinine, Ser 2.13  0.50 - 1.10 mg/dL    Calcium 9.7  8.4 - 08.6 mg/dL    Total Protein 8.2  6.0 - 8.3 g/dL    Albumin 4.1  3.5 - 5.2 g/dL    AST 21  0 - 37 U/L    ALT 6  0 - 35 U/L    Alkaline Phosphatase 80  39 - 117 U/L    Total Bilirubin 0.4  0.3 - 1.2 mg/dL    GFR calc non Af Amer 75 (*) >90 mL/min    GFR calc Af Amer 87 (*) >90 mL/min   LACTIC ACID, PLASMA     Status: Normal   Collection Time   06/25/12  4:10 PM      Component Value Range Comment   Lactic Acid, Venous 1.7  0.5 - 2.2 mmol/L   URINALYSIS, ROUTINE W REFLEX MICROSCOPIC     Status: Abnormal   Collection Time   06/25/12  4:38 PM      Component Value Range Comment   Color, Urine YELLOW  YELLOW    APPearance CLEAR  CLEAR    Specific Gravity, Urine 1.011  1.005 - 1.030    pH 7.0  5.0 - 8.0    Glucose, UA NEGATIVE  NEGATIVE mg/dL    Hgb urine dipstick MODERATE (*) NEGATIVE    Bilirubin Urine NEGATIVE  NEGATIVE    Ketones, ur 15 (*) NEGATIVE mg/dL    Protein, ur 30 (*) NEGATIVE mg/dL    Urobilinogen, UA 0.2  0.0 - 1.0 mg/dL    Nitrite NEGATIVE  NEGATIVE    Leukocytes, UA NEGATIVE  NEGATIVE   URINE MICROSCOPIC-ADD ON     Status: Normal   Collection Time   06/25/12  4:38 PM      Component Value  Range Comment   Squamous Epithelial / LPF RARE  RARE    WBC, UA 0-2  <3 WBC/hpf    RBC / HPF 3-6  <3 RBC/hpf    Bacteria, UA RARE  RARE    Dg Chest Portable 1 View  06/25/2012  *RADIOLOGY REPORT*  Clinical Data: Shortness of breath.  History of lung infection. Cold sweats.  History hypertension.  Kidney infection.  PORTABLE CHEST - 1 VIEW  Comparison: 09/21/2011  Findings: The heart is enlarged.  Aorta is tortuous.  There is patchy density within the right lower lobe, most consistent with infectious infiltrate and atelectasis.  Elevation  of the right hemidiaphragm is consistent with some volume loss.  There are mildly prominent interstitial markings.  Otherwise, the left lung is clear.  Patient has had previous vertebral plasty at multiple levels.  IMPRESSION:  1.  Cardiomegaly. 2.  Right lower lobe atelectasis and infiltrate.   Original Report Authenticated By: Patterson Hammersmith, M.D.     Review of Systems  Constitutional: Positive for chills and diaphoresis.  Eyes: Negative.   Respiratory: Positive for cough, sputum production and shortness of breath.   Cardiovascular: Negative.   Gastrointestinal: Negative.   Genitourinary: Negative.   Musculoskeletal: Negative.   Skin: Negative.   Neurological: Negative.   Endo/Heme/Allergies: Negative.   Psychiatric/Behavioral: Negative.     Blood pressure 114/76, pulse 88, temperature 97.6 F (36.4 C), temperature source Oral, resp. rate 18, height 5\' 4"  (1.626 m), weight 49.442 kg (109 lb), SpO2 98.00%. Physical Exam  Constitutional: She appears well-developed and well-nourished. No distress.  HENT:  Head: Normocephalic and atraumatic.  Right Ear: External ear normal.  Left Ear: External ear normal.  Nose: Nose normal.  Mouth/Throat: Oropharynx is clear and moist. No oropharyngeal exudate.  Eyes: Conjunctivae normal are normal. Pupils are equal, round, and reactive to light. Right eye exhibits no discharge. Left eye exhibits no discharge. No scleral icterus.  Neck: Normal range of motion. Neck supple.  Cardiovascular: Normal rate and regular rhythm.   Respiratory: No respiratory distress. She has wheezes. She has rales.  GI: Soft. Bowel sounds are normal. She exhibits no distension. There is no tenderness. There is no rebound and no guarding.  Musculoskeletal: Normal range of motion. She exhibits no edema and no tenderness.  Neurological: She is alert.       Oriented to Davidson and place. Moves all extremities.  Skin: Skin is warm and dry. She is not diaphoretic.      Assessment/Plan #1. Acute respiratory failure probably from pneumonia  - patient has been started on IV antibiotics for community-acquired pneumonia. Patient is quite hypoxic and requiring nonrebreather. We will get ABG. Check CT chest without contrast (as patient is allergic to contrast) for any mass or any other lung lesions given her history of cigarette smoking. We'll check d-dimer if high get VQ scan. Check BNP and troponin. I have consulted pulmonary critical care for their opinion. Patient has bilateral expiratory wheeze for which I have placed patient on albuterol and Atrovent nebulizers and Pulmicort. If wheezing worsens may need IV steroids. #2. Possible COPD exacerbation - see #1. #3. Hypertension - continue home medications. #4. Hypothyroidism - continue home medications. #5. Dementia - no acute issues. Continue home medications.  CODE STATUS - full code as discussed with patient's daughter.  Eduard Clos 06/25/2012, 9:34 PM

## 2012-06-26 ENCOUNTER — Inpatient Hospital Stay (HOSPITAL_COMMUNITY): Payer: Medicare Other

## 2012-06-26 ENCOUNTER — Encounter (HOSPITAL_COMMUNITY): Payer: Self-pay | Admitting: *Deleted

## 2012-06-26 DIAGNOSIS — D72829 Elevated white blood cell count, unspecified: Secondary | ICD-10-CM | POA: Diagnosis present

## 2012-06-26 DIAGNOSIS — J9811 Atelectasis: Secondary | ICD-10-CM | POA: Diagnosis present

## 2012-06-26 DIAGNOSIS — E860 Dehydration: Secondary | ICD-10-CM | POA: Diagnosis present

## 2012-06-26 DIAGNOSIS — Z720 Tobacco use: Secondary | ICD-10-CM | POA: Diagnosis present

## 2012-06-26 LAB — CREATININE, SERUM
Creatinine, Ser: 0.53 mg/dL (ref 0.50–1.10)
GFR calc Af Amer: >90 mL/min (ref >90)

## 2012-06-26 LAB — CBC WITH DIFFERENTIAL/PLATELET
Eosinophils Relative: 7 % — ABNORMAL HIGH (ref 0–5)
HCT: 42.5 % (ref 36.0–46.0)
Hemoglobin: 14.7 g/dL (ref 12.0–15.0)
Lymphocytes Relative: 25 % (ref 12–46)
Lymphs Abs: 2.5 10*3/uL (ref 0.7–4.0)
MCV: 93.8 fL (ref 78.0–100.0)
Monocytes Absolute: 0.9 10*3/uL (ref 0.1–1.0)
Monocytes Relative: 9 % (ref 3–12)
RBC: 4.53 MIL/uL (ref 3.87–5.11)
RDW: 13.1 % (ref 11.5–15.5)
WBC: 9.8 10*3/uL (ref 4.0–10.5)

## 2012-06-26 LAB — CBC
Platelets: 201 10*3/uL (ref 150–400)
RBC: 5.02 MIL/uL (ref 3.87–5.11)
RDW: 12.9 % (ref 11.5–15.5)
WBC: 11.8 10*3/uL — ABNORMAL HIGH (ref 4.0–10.5)

## 2012-06-26 LAB — PRO B NATRIURETIC PEPTIDE: Pro B Natriuretic peptide (BNP): 1662 pg/mL — ABNORMAL HIGH (ref 0–450)

## 2012-06-26 LAB — COMPREHENSIVE METABOLIC PANEL
Albumin: 3.3 g/dL — ABNORMAL LOW (ref 3.5–5.2)
Alkaline Phosphatase: 65 U/L (ref 39–117)
BUN: 9 mg/dL (ref 6–23)
Potassium: 3.6 mEq/L (ref 3.5–5.1)
Total Protein: 6.6 g/dL (ref 6.0–8.3)

## 2012-06-26 LAB — STREP PNEUMONIAE URINARY ANTIGEN: Strep Pneumo Urinary Antigen: NEGATIVE

## 2012-06-26 MED ORDER — SODIUM CHLORIDE 0.9 % IV BOLUS (SEPSIS)
500.0000 mL | Freq: Once | INTRAVENOUS | Status: AC
  Administered 2012-06-26: 500 mL via INTRAVENOUS

## 2012-06-26 MED ORDER — SODIUM CHLORIDE 0.9 % IV SOLN
INTRAVENOUS | Status: DC
  Administered 2012-06-26: 75 mL via INTRAVENOUS
  Administered 2012-06-27: 07:00:00 via INTRAVENOUS
  Administered 2012-06-27: 75 mL/h via INTRAVENOUS
  Administered 2012-06-29: 20 mL/h via INTRAVENOUS

## 2012-06-26 MED ORDER — ENSURE COMPLETE PO LIQD
237.0000 mL | Freq: Two times a day (BID) | ORAL | Status: DC
  Administered 2012-06-27 – 2012-06-30 (×8): 237 mL via ORAL

## 2012-06-26 NOTE — Progress Notes (Signed)
INITIAL ADULT NUTRITION ASSESSMENT Date: 06/26/2012   Time: 3:18 PM  INTERVENTION:  Ensure Complete supplement twice daily between meals (350 kcals, 13 gm protein per 8 fl oz bottle) RD to follow for nutrition care plan  DOCUMENTATION CODES Per approved criteria  -Underweight   Reason for Assessment: Health Hx  ASSESSMENT: Female 78 y.o.  Dx: Acute respiratory failure with hypoxia  Hx:  Past Medical History  Diagnosis Date  . Dementia   . Osteoarthritis   . Hypertension   . Thyroid disease   . Renal disorder     Related Meds:     . albuterol  2.5 mg Nebulization Q6H  . amLODipine  5 mg Oral Daily  . azithromycin (ZITHROMAX) 500 MG IVPB  500 mg Intravenous Once  . azithromycin  500 mg Intravenous Q24H  . budesonide  0.5 mg Nebulization BID  . cefTRIAXone (ROCEPHIN) IVPB 1 gram/50 mL D5W  1 g Intravenous Q24H  . enoxaparin (LOVENOX) injection  40 mg Subcutaneous Q24H  . gabapentin  300 mg Oral TID  . ipratropium  0.5 mg Nebulization Q6H  . levothyroxine  50 mcg Oral QAC breakfast  . memantine  10 mg Oral BID  . metoprolol succinate  25 mg Oral Daily  . mirtazapine  30 mg Oral QHS  . sodium chloride  500 mL Intravenous Once  . sodium chloride  3 mL Intravenous Q12H  . DISCONTD: cefTRIAXone (ROCEPHIN)  IV  1 g Intravenous Q24H  . DISCONTD: sodium chloride  3 mL Intravenous Q12H    Ht: 5\' 5"  (165.1 cm)  Wt: 109 lb 2 oz (49.5 kg)  Ideal Wt: 56.8 kg % Ideal Wt: 87%  Usual Wt: unknown % Usual Wt: ---  Body mass index is 18.16 kg/(m^2).  Food/Nutrition Related Hx: admission nutrition screen incomplete  Labs:  CMP     Component Value Date/Time   NA 140 06/26/2012 0500   K 3.6 06/26/2012 0500   CL 102 06/26/2012 0500   CO2 26 06/26/2012 0500   GLUCOSE 97 06/26/2012 0500   BUN 9 06/26/2012 0500   CREATININE 0.65 06/26/2012 0500   CALCIUM 9.3 06/26/2012 0500   PROT 6.6 06/26/2012 0500   ALBUMIN 3.3* 06/26/2012 0500   AST 26 06/26/2012 0500   ALT 7 06/26/2012  0500   ALKPHOS 65 06/26/2012 0500   BILITOT 0.3 06/26/2012 0500   GFRNONAA 77* 06/26/2012 0500   GFRAA 89* 06/26/2012 0500     Intake/Output Summary (Last 24 hours) at 06/26/12 1519 Last data filed at 06/26/12 1234  Gross per 24 hour  Intake     75 ml  Output    400 ml  Net   -325 ml    Diet Order: Cardiac  Supplements/Tube Feeding: N/A  IVF:    sodium chloride Last Rate: 75 mL (06/26/12 1234)    Estimated Nutritional Needs:   Kcal: 1500-1700 Protein: 70-80 gm Fluid: 1.5-1.7 L  Patient was brought to the ER at [facility]; chest x-ray revealed infiltrates and patient has been started IV antibiotics for community-acquired pneumonia; transferred to [facility]; medical team suspects may have underlying obstructing mass  Patient reports her appetite is poor; states she usually eats well at home; patient with visible subcutaneous fat loss to clavicles & upper body; RD suspects some level of malnutrition; RN offered patient Ensure supplement -- RD to place active order in Order Management.  NUTRITION DIAGNOSIS: -Inadequate oral intake (NI-2.1).  Status: Ongoing  RELATED TO: poor appetite  AS EVIDENCE  BY: patient report  MONITORING/EVALUATION(Goals): Goal: Oral intake with meals & supplements to meet >/= 90% of estimated nutrition needs Monitor: PO & supplemental intake, weight, labs, I/O's  EDUCATION NEEDS: -No education needs identified at this time  Dietitian #: [phone]  Alger Memos 06/26/2012, 3:18 PM

## 2012-06-26 NOTE — Progress Notes (Addendum)
Notes were added to HX portion, but put here as well: Daughter cares for Pt at home as well as an invalid husband and disabled daughter, who has one leg. Concern for caregiver burden. Mentioned to SW & case mgt.

## 2012-06-26 NOTE — Progress Notes (Signed)
I have reviewed and discussed the care of this patient in detail with the nurse practitioner including pertinent patient records, physical exam findings and data. I agree with details of this encounter.  

## 2012-06-26 NOTE — Progress Notes (Signed)
[facility] Progress Note  TEAM 1 - Stepdown/ICU TEAM   [name] Davidson GUY:[id number] DOB: [birth year] DOA: 06/25/2012 PCP: Pamelia Hoit, MD  Brief narrative: 78 year old female patient with multiple medical problems including dementia. She lives with her daughter. She had developed progressive shortness of breath within the past 24 hours and was initially taken to the emergency room [facility]. Chest x-ray performed there revealed infiltrates and the patient had been empirically started on IV antibiotics for community-acquired pneumonia. Patient was placed on 100% nonrebreather mask to maintain saturations greater than 92%. Because of this request was made to admit the patient to the step down unit. She was subsequently transferred to  directly to the step down unit where she was evaluated by the admitting physician. At time of admission patient was awake enough to deny any chest pain, nausea vomiting or abdominal pain. The daughter who accompanied the patient reports the patient has had productive cough over the past 4-6 weeks and has undergone 2 courses of antibiotic therapy from the primary care physician. Initial antibiotic with Zithromax. She was deemed stable enough to remain on the step down unit.  Assessment/Plan: Principal Problem:  *Acute respiratory failure with hypoxia: A)  Collapse of right lung-middle lobe *Has had recurrent pneumonias over the past 6 weeks and has failed outpatient Zithromax and other antibiotic *Suspect may have underlying obstructing mass-see below *On exam is not moving any air on the right side *Continue supportive care with oxygen B)  Obstructive pneumonia - ? due to mass *As noted above patient has collapse of the right middle lobe of the lung. This is associated with mild dilatation of the trachea with opacification of the proximal right upper lobe bronchus and bronchus intermedius as well as the right middle lobe  bronchi. *Differential includes severe pneumonia versus obstructing malignancy *Extensive discussion held by Dr. Phillips Odor with the patient's daughter including her concerns this is an underlying malignancy. Goals of care were addressed and patient is now a DO NOT RESUSCITATE. *The daughter would like further workup to determine a definitive diagnosis, if possible, and would like to discuss further with the pulmonologist a consultation has been requested.  Active Problems:  Dementia *Has become progressive over the past 12 months *Prior to admission the patient was ambulatory and able to dress herself with minimal assistance *If able to swallow continue memantine and mirtzapine   Dehydration *Likely related to acute pulmonary illness in the setting of a patient with dementia *Had low urinary output this morning so 500 cc normal saline bolus was given *We'll go ahead and begin IV fluids at 75 cc per hour   Leukocytosis *Related to pneumonia and dehydration   HTN (hypertension) *Blood pressure controlled *Continue Norvasc   Hypothyroidism *Continue Synthroid   Tobacco use- remote *Risk factor for pulmonary malignancy   DVT prophylaxis: Lovenox 40 mg subcutaneous Code Status: Now is DO NOT RESUSCITATE Family Communication: Discussed patient's status extensively with daughter at the bedside this morning Disposition Plan: Remain in stepdown  Consultants: Pulmonary medicine  Procedures: None  Antibiotics: Zithromax 10/6 >>> Rocephin 10/6 >>>  HPI/Subjective: Patient not responsive. Daughter at bedside. Extensive discussion held regarding suspected diagnosis and poor prognosis. Emotional support given   Objective: Blood pressure 120/52, pulse 73, temperature 97.7 F (36.5 C), temperature source Axillary, resp. rate 17, height 5\' 5"  (1.651 m), weight 49.5 kg (109 lb 2 oz), SpO2 93.00%.  Intake/Output Summary (Last 24 hours) at 06/26/12 1141 Last data filed at 06/26/12  0420  Gross per 24 hour  Intake      0 ml  Output    350 ml  Net   -350 ml     Exam: General: No acute respiratory distress Lungs: Clear to auscultation bilaterally without wheezes or crackles Cardiovascular: Regular rate and rhythm without murmur gallop or rub normal S1 and S2 Abdomen: Nontender, nondistended, soft, bowel sounds positive, no rebound, no ascites, no appreciable mass Extremities: No significant cyanosis, clubbing, or edema bilateral lower extremities  Data Reviewed: Basic Metabolic Panel:  Lab 06/26/12 6578 06/25/12 2331 06/25/12 1530  NA 140 -- 140  K 3.6 -- 3.4*  CL 102 -- 100  CO2 26 -- 26  GLUCOSE 97 -- 185*  BUN 9 -- 13  CREATININE 0.65 0.53 0.70  CALCIUM 9.3 -- 9.7  MG -- -- --  PHOS -- -- --   Liver Function Tests:  Lab 06/26/12 0500 06/25/12 1530  AST 26 21  ALT 7 6  ALKPHOS 65 80  BILITOT 0.3 0.4  PROT 6.6 8.2  ALBUMIN 3.3* 4.1   No results found for this basename: LIPASE:5,AMYLASE:5 in the last 168 hours No results found for this basename: AMMONIA:5 in the last 168 hours CBC:  Lab 06/26/12 0500 06/25/12 2331 06/25/12 1530  WBC 9.8 11.8* 12.4*  NEUTROABS 5.8 -- 8.6*  HGB 14.7 16.3* 16.2*  HCT 42.5 46.7* 47.5*  MCV 93.8 93.0 93.5  PLT 208 201 244   Cardiac Enzymes:  Lab 06/25/12 2331  CKTOTAL --  CKMB --  CKMBINDEX --  TROPONINI <0.30   BNP (last 3 results)  Basename 06/25/12 2331  PROBNP 1662.0*   CBG: No results found for this basename: GLUCAP:5 in the last 168 hours  Recent Results (from the past 240 hour(s))  MRSA PCR SCREENING     Status: Normal   Collection Time   06/25/12  8:52 PM      Component Value Range Status Comment   MRSA by PCR NEGATIVE  NEGATIVE Final      Studies:  Recent x-ray studies have been reviewed in detail by the Attending Physician  Scheduled Meds:  Reviewed in detail by the Attending Physician   Junious Silk, ANP [facility] Office  [phone] Pager  [phone]  On-Call/Text Page:      [email or web address]      password [email or web address]  If 7PM-7AM, please contact night-coverage [email or web address] Password [email or web address] 06/26/2012, 11:41 AM   LOS: 1 day

## 2012-06-26 NOTE — Progress Notes (Signed)
Pt daughter brought CD of XRAY for PCCM (taped in shadow chart)

## 2012-06-26 NOTE — Progress Notes (Signed)
Transported pt to and from Radiology for Chest CT. Tolerated well.

## 2012-06-26 NOTE — Progress Notes (Signed)
Utilization review completed.  

## 2012-06-26 NOTE — Consult Note (Signed)
PULMONARY/CCM CONSULT NOTE  Requesting MD/Service: Golding/[facility] Date of admission: 10/06 Date of consult: 10/07 Reason for consultation: Post obstructive PNA. Eval for possible lung mass  Pt Profile:  89 yowf with moderate to severe dementia and recurrent R sided pneumonias recently. Admitted with hypoxic respiratory failure and RLL AS disease. CT chest suggests partial obstruction on bronchus intermedius     HPI:  Pt is unable to provide any meaningful history. Records reviewed in detail. She has recently been treated as outpt with Azithro for PNA. She suffered rather acute onset of dyspnea and hypoxia in the 24 hrs prior to this current hospitalization. Per her daughter, she has moderate cognitive impairment @ baseline and is limited in her ability to get around. She has a remote smoking history quitting many yrs ago per daughter. NCB status noted  Past Medical History  Diagnosis Date  . Dementia   . Osteoarthritis   . Hypertension   . Thyroid disease   . Renal disorder     MEDICATIONS: reviewed  History   Social History  . Marital Status: Widowed    Spouse Name: N/A    Number of Children: N/A  . Years of Education: N/A   Occupational History  . Not on file.   Social History Main Topics  . Smoking status: Former Games developer  . Smokeless tobacco: Not on file  . Alcohol Use: No  . Drug Use: No  . Sexually Active: No   Other Topics Concern  . Not on file   Social History Narrative  . No narrative on file    History reviewed. No pertinent family history.  ROS - NA  Filed Vitals:   06/26/12 1500 06/26/12 1549 06/26/12 2000 06/26/12 2107  BP:  124/106 136/86   Pulse:   78   Temp:  98.1 F (36.7 C) 97.5 F (36.4 C)   TempSrc:  Oral Oral   Resp:   19   Height:      Weight:      SpO2: 97%  97% 96%    EXAM:  Gen: Frail, poorly oriented, on VM without overt respiratory distress HEENT: WNL Neck: noJVD Lungs: coarse BS throughout, diminished in R base, no  wheezes Cardiovascular: RRR s M Abdomen: NABS Ext: No edema, warm Neuro: no focal deficits Skin:   DATA:  BMET    Component Value Date/Time   NA 140 06/26/2012 0500   K 3.6 06/26/2012 0500   CL 102 06/26/2012 0500   CO2 26 06/26/2012 0500   GLUCOSE 97 06/26/2012 0500   BUN 9 06/26/2012 0500   CREATININE 0.65 06/26/2012 0500   CALCIUM 9.3 06/26/2012 0500   GFRNONAA 77* 06/26/2012 0500   GFRAA 89* 06/26/2012 0500    CBC    Component Value Date/Time   WBC 9.8 06/26/2012 0500   RBC 4.53 06/26/2012 0500   HGB 14.7 06/26/2012 0500   HCT 42.5 06/26/2012 0500   PLT 208 06/26/2012 0500   MCV 93.8 06/26/2012 0500   MCH 32.5 06/26/2012 0500   MCHC 34.6 06/26/2012 0500   RDW 13.1 06/26/2012 0500   LYMPHSABS 2.5 06/26/2012 0500   MONOABS 0.9 06/26/2012 0500   EOSABS 0.6 06/26/2012 0500   BASOSABS 0.0 06/26/2012 0500    CT chest reviewed. There appears to be an "apple core" like obstruction in the bronchus intermedius which could be due to mucus or tumor. Distal to this, there is substantial collapse in the RML and RLL. There are chronic granulomatous changes.  IMPRESSION:   Principal Problem:  *Acute respiratory failure with hypoxia Active Problems:  Obstructive pneumonia - ? due to mass  HTN (hypertension)  Hypothyroidism  Dementia  Dehydration  Leukocytosis  Collapse of right lung-middle lobe  Tobacco use- remote  DISC: I spoke with patient's daughter over the phone. The CT findings and history of recurrent pneumonias are certainly worrisome for endobronchial tumor. We discussed potential risk and benefits of pursuing a definitive diagnosis with FOB. She would be at very high risk of respiratory deterioration if FOB were performed at this time. She would not be a candidate for any specific therapies for lung cancer if one were to be diagnosed. It is unlikely that FOB would be significantly therapeutic (ie. mucus removal). I have suggested the floowing plan   PLAN:  1) I have requested  that the pt's daughter obtain recent CXR's from Dr Tawana Scale office for my review 2) No FOB for now 3) treat as PNA - Would transition from IV abx to Augmentin or moxifloxacin when able to take POs and complete 3 wks total for presumed post-obstructive PNA 4) Repeat CXR just prior to discharge and again @ time of completion of abx course 5) consider repeat limited CT chest and/or FOB depending on status in 3-4 wks and depending on how much we (and daughter) feel that a definitive diagnosis is desired   Billy Fischer, MD ; [facility] service Mobile [phone].  After 5:30 PM or weekends, call [phone]

## 2012-06-27 DIAGNOSIS — J9819 Other pulmonary collapse: Secondary | ICD-10-CM

## 2012-06-27 DIAGNOSIS — E860 Dehydration: Secondary | ICD-10-CM

## 2012-06-27 LAB — LEGIONELLA ANTIGEN, URINE: Legionella Antigen, Urine: NEGATIVE

## 2012-06-27 MED ORDER — AZITHROMYCIN 500 MG PO TABS
500.0000 mg | ORAL_TABLET | Freq: Every day | ORAL | Status: DC
  Administered 2012-06-27 – 2012-06-30 (×4): 500 mg via ORAL
  Filled 2012-06-27 (×4): qty 1

## 2012-06-27 NOTE — Progress Notes (Signed)
Pt on room air today, off venti. Pt TX to 6N 32, VSS, called report.

## 2012-06-27 NOTE — Progress Notes (Signed)
[facility] Progress Note [place] TEAM 1 - Stepdown/ICU TEAM   Mauria Shontz ZOX:[id number] DOB: 1921/11/27 DOA: 06/25/2012 PCP: Pamelia Hoit, MD  Brief narrative: 78 year old female patient with multiple medical problems including dementia. She lives with her daughter. She had developed progressive shortness of breath within the past 24 hours and was initially taken to the emergency room at [facility]. Chest x-ray performed there revealed infiltrates and the patient had been empirically started on IV antibiotics for community-acquired pneumonia. Patient was placed on 100% nonrebreather mask to maintain saturations greater than 92%. Because of this request was made to admit the patient to the step down unit. She was subsequently transferred to [place] directly to the step down unit where she was evaluated by the admitting physician. At time of admission patient was awake enough to deny any chest pain, nausea vomiting or abdominal pain. The daughter who accompanied the patient reports the patient has had productive cough over the past 4-6 weeks and has undergone 2 courses of antibiotic therapy from the primary care physician. Initial antibiotic with Zithromax. She was deemed stable enough to remain on the step down unit.  Assessment/Plan: Principal Problem:  *Acute respiratory failure with hypoxia: A)  Collapse of right lung-middle lobe *Clinically has improved markedly since yesterday. Now on room air maintaining saturations greater than 95%  *Has had recurrent pneumonias over the past 6 weeks and has failed outpatient Zithromax and other antibiotic *Suspect may have underlying obstructing mass-see below *On exam has limited airway movement noted on right *Continue supportive care with oxygen B)  Obstructive pneumonia - ? due to mass *As noted above patient has collapse of the right middle lobe of the lung. This is associated with mild dilatation of the trachea with  opacification of the proximal right upper lobe bronchus and bronchus intermedius as well as the right middle lobe bronchi. *Differential includes severe pneumonia versus obstructing malignancy *Extensive discussion held by Dr. Phillips Odor with the patient's daughter including her concerns this is an underlying malignancy. Goals of care were addressed and patient is now a DO NOT RESUSCITATE. *The daughter would like further workup to determine a definitive diagnosis, if possible, and would like to discuss further with the pulmonologist a consultation has been requested. Pulmonary medicine has evaluated the patient and their recommendations are as follows:   I ( Dr. Sung Amabile) spoke with patient's daughter over the phone. The CT findings and history of recurrent pneumonias are certainly worrisome for endobronchial tumor. We discussed potential risk and benefits of pursuing a definitive diagnosis with FOB. She would be at very high risk of respiratory deterioration if FOB were performed at this time. She would not be a candidate for any specific therapies for lung cancer if one were to be diagnosed. It is unlikely that FOB would be significantly therapeutic (ie. mucus removal). I have suggested the following plan: 1) I (Dr. Sung Amabile) have requested that the pt's daughter obtain recent CXR's from Dr Tawana Scale office for my review  2) No FOB for now  3) treat as PNA - Would transition from IV abx to Augmentin or moxifloxacin when able to take POs and complete 3 wks total for presumed post-obstructive PNA  4) Repeat CXR just prior to discharge and again @ time of completion of abx course  5) consider repeat limited CT chest and/or FOB depending on status in 3-4 wks and depending on how much we (and daughter) feel that a definitive diagnosis is desired  Active Problems:  Dementia *Has become progressive over the past 12 months *Prior to admission the patient was ambulatory and able to dress herself with minimal  assistance *If able to swallow continue memantine and mirtzapine   Dehydration *Likely related to acute pulmonary illness in the setting of a patient with dementia *Urinary output is marginal and oral intake also limited *Continue IV fluids at 75 cc per hour   Leukocytosis *Related to pneumonia and dehydration   HTN (hypertension) *Blood pressure controlled *Continue Norvasc   Hypothyroidism *Continue Synthroid   Tobacco use- remote *Risk factor for pulmonary malignancy   DVT prophylaxis: Lovenox 40 mg subcutaneous Code Status: Now is DO NOT RESUSCITATE Family Communication: Daughter not available during our evaluation this morning Disposition Plan: Transfer to floor  Consultants: Pulmonary medicine  Procedures: None  Antibiotics: Zithromax 10/6 >>> Rocephin 10/6 >>>  HPI/Subjective: Patient not responsive. Daughter at bedside. Extensive discussion held regarding suspected diagnosis and poor prognosis. Emotional support given   Objective: Blood pressure 143/65, pulse 77, temperature 97.8 F (36.6 C), temperature source Oral, resp. rate 8, height 5\' 5"  (1.651 m), weight 49.5 kg (109 lb 2 oz), SpO2 99.00%.  Intake/Output Summary (Last 24 hours) at 06/27/12 1603 Last data filed at 06/27/12 0811  Gross per 24 hour  Intake 1550.5 ml  Output    675 ml  Net  875.5 ml     Exam: General: No acute respiratory distress Lungs: Clear to auscultation bilaterally without wheezes or crackles-less diminished on right side and compared to yesterday 06/26/2012 Cardiovascular: Regular rate and rhythm without murmur gallop or rub normal S1 and S2 Abdomen: Nontender, nondistended, soft, bowel sounds positive, no rebound, no ascites, no appreciable mass Extremities: No significant cyanosis, clubbing, or edema bilateral lower extremities  Data Reviewed: Basic Metabolic Panel:  Lab 06/26/12 4098 06/25/12 2331 06/25/12 1530  NA 140 -- 140  K 3.6 -- 3.4*  CL 102 -- 100  CO2  26 -- 26  GLUCOSE 97 -- 185*  BUN 9 -- 13  CREATININE 0.65 0.53 0.70  CALCIUM 9.3 -- 9.7  MG -- -- --  PHOS -- -- --   Liver Function Tests:  Lab 06/26/12 0500 06/25/12 1530  AST 26 21  ALT 7 6  ALKPHOS 65 80  BILITOT 0.3 0.4  PROT 6.6 8.2  ALBUMIN 3.3* 4.1   No results found for this basename: LIPASE:5,AMYLASE:5 in the last 168 hours No results found for this basename: AMMONIA:5 in the last 168 hours CBC:  Lab 06/26/12 0500 06/25/12 2331 06/25/12 1530  WBC 9.8 11.8* 12.4*  NEUTROABS 5.8 -- 8.6*  HGB 14.7 16.3* 16.2*  HCT 42.5 46.7* 47.5*  MCV 93.8 93.0 93.5  PLT 208 201 244   Cardiac Enzymes:  Lab 06/25/12 2331  CKTOTAL --  CKMB --  CKMBINDEX --  TROPONINI <0.30   BNP (last 3 results)  Basename 06/25/12 2331  PROBNP 1662.0*   CBG: No results found for this basename: GLUCAP:5 in the last 168 hours  Recent Results (from the past 240 hour(s))  MRSA PCR SCREENING     Status: Normal   Collection Time   06/25/12  8:52 PM      Component Value Range Status Comment   MRSA by PCR NEGATIVE  NEGATIVE Final      Studies:  Recent x-ray studies have been reviewed in detail by the Attending Physician  Scheduled Meds:  Reviewed in detail by the Attending Physician   Junious Silk, ANP [facility] Office  [phone] Pager  [phone]  On-Call/Text Page:      [email or web address]      password [email or web address]  If 7PM-7AM, please contact night-coverage [email or web address] Password [email or web address] 06/27/2012, 4:03 PM   LOS: 2 days   I have examined the patient, reviewed the chart and agree with the above note.   Calvert Cantor, MD [phone]

## 2012-06-28 NOTE — Progress Notes (Signed)
[facility] PROGRESS NOTE  Aryia Delira ZOX:[id number] DOB: [birth year] DOA: 06/25/2012 PCP: Pamelia Hoit, MD  Assessment/Plan: Principal Problem:  *Acute respiratory failure with hypoxia Active Problems:  Obstructive pneumonia - ? due to mass  HTN (hypertension)  Hypothyroidism  Dementia  Dehydration  Leukocytosis  Collapse of right lung-middle lobe  Tobacco use- remote  78 year old female patient with multiple medical problems including dementia. She lives with her daughter. She had developed progressive shortness of breath within the past 24 hours and was initially taken to the emergency room at [facility]. Chest x-ray performed there revealed infiltrates and the patient had been empirically started on IV antibiotics for community-acquired pneumonia. Patient was placed on 100% nonrebreather mask to maintain saturations greater than 92%. Because of this request was made to admit the patient to the step down unit. She was subsequently transferred to [place] directly to the step down unit where she was evaluated by the admitting physician. At time of admission patient was awake enough to deny any chest pain, nausea vomiting or abdominal pain. The daughter who accompanied the patient reports the patient has had productive cough over the past 4-6 weeks and has undergone 2 courses of antibiotic therapy from the primary care physician. Initial antibiotic with Zithromax. She was deemed stable enough to remain on the step down unit.   Assessment/Plan:  Principal Problem:  *Acute respiratory failure with hypoxia:  A) Collapse of right lung-middle lobe  *Clinically has improved markedly since yesterday. Now on room air maintaining saturations greater than 95%  *Has had recurrent pneumonias over the past 6 weeks and has failed outpatient Zithromax and other antibiotic  *Suspect may have underlying obstructing mass-see below  *On exam has limited airway movement noted on  right  *Continue supportive care with oxygen  B) Obstructive pneumonia - ? due to mass  *As noted above patient has collapse of the right middle lobe of the lung. This is associated with mild dilatation of the trachea with opacification of the proximal right upper lobe bronchus and bronchus intermedius as well as the right middle lobe bronchi.  *Differential includes severe pneumonia versus obstructing malignancy  *Extensive discussion held by Dr. Phillips Odor with the patient's daughter including her concerns this is an underlying malignancy. Goals of care were addressed and patient is now a DO NOT RESUSCITATE.  *The daughter would like further workup to determine a definitive diagnosis, if possible, and would like to discuss further with the pulmonologist a consultation has been requested. Pulmonary medicine has evaluated the patient and their recommendations are as follows:  I ( Dr. Sung Amabile) spoke with patient's daughter over the phone. The CT findings and history of recurrent pneumonias are certainly worrisome for endobronchial tumor. We discussed potential risk and benefits of pursuing a definitive diagnosis with FOB. She would be at very high risk of respiratory deterioration if FOB were performed at this time. She would not be a candidate for any specific therapies for lung cancer if one were to be diagnosed. It is unlikely that FOB would be significantly therapeutic (ie. mucus removal). I have suggested the following plan:  1) I (Dr. Sung Amabile) have requested that the pt's daughter obtain recent CXR's from Dr Tawana Scale office for my review  2) No FOB for now  3) treat as PNA - Would transition from IV abx to Augmentin or moxifloxacin when able to take POs and complete 3 wks total for presumed post-obstructive PNA  4) Repeat CXR just prior to discharge and again @  time of completion of abx course  5) consider repeat limited CT chest and/or FOB depending on status in 3-4 wks and depending on how much we  (and daughter) feel that a definitive diagnosis is desired  Active Problems:  Dementia  *Has become progressive over the past 12 months  *Prior to admission the patient was ambulatory and able to dress herself with minimal assistance  *If able to swallow continue memantine and mirtzapine  Dehydration  *Likely related to acute pulmonary illness in the setting of a patient with dementia  *Urinary output is marginal and oral intake also limited  *Continue IV fluids at 75 cc per hour  Leukocytosis  *Related to pneumonia and dehydration  HTN (hypertension)  *Blood pressure controlled  *Continue Norvasc  Hypothyroidism  *Continue Synthroid  Tobacco use- remote  *Risk factor for pulmonary malignancy  DVT prophylaxis: Lovenox 40 mg subcutaneous  Code Status: Now is DO NOT RESUSCITATE  Family Communication: Daughter not available during our evaluation this morning  Disposition Plan: Transfer to floor , possible discharge tomorrow   Consultants:  Pulmonary medicine  Procedures:  None  Antibiotics:  Zithromax 10/6 >>>  Rocephin 10/6 >>>  HPI/Subjective:   Patient conversant. Seems to be alert and oriented to self and place, Daughter at bedside. Extensive discussion held regarding suspected diagnosis and poor prognosis. Emotional support given     Objective: Filed Vitals:   06/28/12 0600 06/28/12 0931 06/28/12 1334 06/28/12 1415  BP: 123/61   144/70  Pulse: 79   84  Temp: 97.7 F (36.5 C)   98.4 F (36.9 C)  TempSrc:    Oral  Resp: 16   18  Height:      Weight:      SpO2: 93% 93% 92% 92%    Intake/Output Summary (Last 24 hours) at 06/28/12 1627 Last data filed at 06/28/12 1456  Gross per 24 hour  Intake 1663.75 ml  Output   4500 ml  Net -2836.25 ml    Exam:  HENT:  Head: Atraumatic.  Nose: Nose normal.  Mouth/Throat: Oropharynx is clear and moist.  Eyes: Conjunctivae are normal. Pupils are equal, round, and reactive to light. No scleral icterus.  Neck: Neck  supple. No tracheal deviation present.  Cardiovascular: Normal rate, regular rhythm, normal heart sounds and intact distal pulses.  Pulmonary/Chest: Effort normal and breath sounds normal. No respiratory distress.  Abdominal: Soft. Normal appearance and bowel sounds are normal. She exhibits no distension. There is no tenderness.  Musculoskeletal: She exhibits no edema and no tenderness.  Neurological: She is alert. No cranial nerve deficit.    Data Reviewed: Basic Metabolic Panel:  Lab 06/26/12 1610 06/25/12 2331 06/25/12 1530  NA 140 -- 140  K 3.6 -- 3.4*  CL 102 -- 100  CO2 26 -- 26  GLUCOSE 97 -- 185*  BUN 9 -- 13  CREATININE 0.65 0.53 0.70  CALCIUM 9.3 -- 9.7  MG -- -- --  PHOS -- -- --    Liver Function Tests:  Lab 06/26/12 0500 06/25/12 1530  AST 26 21  ALT 7 6  ALKPHOS 65 80  BILITOT 0.3 0.4  PROT 6.6 8.2  ALBUMIN 3.3* 4.1   No results found for this basename: LIPASE:5,AMYLASE:5 in the last 168 hours No results found for this basename: AMMONIA:5 in the last 168 hours  CBC:  Lab 06/26/12 0500 06/25/12 2331 06/25/12 1530  WBC 9.8 11.8* 12.4*  NEUTROABS 5.8 -- 8.6*  HGB 14.7 16.3* 16.2*  HCT 42.5  46.7* 47.5*  MCV 93.8 93.0 93.5  PLT 208 201 244    Cardiac Enzymes:  Lab 06/25/12 2331  CKTOTAL --  CKMB --  CKMBINDEX --  TROPONINI <0.30   BNP (last 3 results)  Basename 06/25/12 2331  PROBNP 1662.0*     CBG: No results found for this basename: GLUCAP:5 in the last 168 hours  Recent Results (from the past 240 hour(s))  MRSA PCR SCREENING     Status: Normal   Collection Time   06/25/12  8:52 PM      Component Value Range Status Comment   MRSA by PCR NEGATIVE  NEGATIVE Final      Studies: Ct Chest Wo Contrast  06/26/2012  *RADIOLOGY REPORT*  Clinical Data: 78 year old female with acute respiratory failure. IV contrast allergy.  CT CHEST WITHOUT CONTRAST  Technique:  Multidetector CT imaging of the chest was performed following the standard  protocol without IV contrast.  Comparison: Chest radiographs 06/25/2012 and earlier.  Findings: The trachea is mildly dilated and patent.  Mild impression on the main left and right bronchi seems related to mediastinal vasculature.  The proximal right upper lobe bronchus and bronchus intermedius are opacified.  Right middle lobe bronchi are opacified proximally.  Right lower lobe bronchi are most severely opacified.  There is a mix of volume loss, pleural fluid, and some consolidation at the right lung base.  Paraseptal emphysema in the right upper lobe which remains clear. There is collapse of the medial segment of the right middle lobe. The lateral segment is clear except for scarring and some emphysema.  Scarring and paraseptal emphysema in the left lung.  Occasional calcified granulomas on the left.  Rightward shift of the mediastinum.  Calcified and ectatic visible aorta.  Ectatic great vessels.  No definite mediastinal lymphadenopathy.  Gallbladder surgically absent.  Other visualized upper abdominal viscera are within normal limits for age.  Multiple chronic-appearing thoracic compression fractures, those in the lower thoracic spine and upper lumbar spine have been previously augmented with bone cement.  No definite acute osseous abnormality.  IMPRESSION: 1.  Opacified or obstructed distal right mainstem bronchus and other right lung airways, especially the right lower lobe airways. This could be related to pneumonia or neoplasm. 2.  Combination of small pleural effusion, collapse, and consolidation in the right lower lobe.  Medial segment right middle lobe collapse. 3.  If right lung ventilation does not improve following pulmonary toilet, recommend bronchoscopy to exclude obstructing neoplasm.   Original Report Authenticated By: Harley Hallmark, M.D.    Dg Chest Portable 1 View  06/25/2012  *RADIOLOGY REPORT*  Clinical Data: Shortness of breath.  History of lung infection. Cold sweats.  History  hypertension.  Kidney infection.  PORTABLE CHEST - 1 VIEW  Comparison: 09/21/2011  Findings: The heart is enlarged.  Aorta is tortuous.  There is patchy density within the right lower lobe, most consistent with infectious infiltrate and atelectasis.  Elevation of the right hemidiaphragm is consistent with some volume loss.  There are mildly prominent interstitial markings.  Otherwise, the left lung is clear.  Patient has had previous vertebral plasty at multiple levels.  IMPRESSION:  1.  Cardiomegaly. 2.  Right lower lobe atelectasis and infiltrate.   Original Report Authenticated By: Patterson Hammersmith, M.D.     Scheduled Meds:   . albuterol  2.5 mg Nebulization Q6H  . amLODipine  5 mg Oral Daily  . azithromycin  500 mg Oral Q1500  . budesonide  0.5  mg Nebulization BID  . cefTRIAXone (ROCEPHIN) IVPB 1 gram/50 mL D5W  1 g Intravenous Q24H  . enoxaparin (LOVENOX) injection  40 mg Subcutaneous Q24H  . feeding supplement  237 mL Oral BID BM  . gabapentin  300 mg Oral TID  . ipratropium  0.5 mg Nebulization Q6H  . levothyroxine  50 mcg Oral QAC breakfast  . memantine  10 mg Oral BID  . metoprolol succinate  25 mg Oral Daily  . mirtazapine  30 mg Oral QHS  . sodium chloride  3 mL Intravenous Q12H   Continuous Infusions:   . sodium chloride 75 mL/hr (06/27/12 2100)    Principal Problem:  *Acute respiratory failure with hypoxia Active Problems:  Obstructive pneumonia - ? due to mass  HTN (hypertension)  Hypothyroidism  Dementia  Dehydration  Leukocytosis  Collapse of right lung-middle lobe  Tobacco use- remote    Time spent: 40 minutes   [facility]  [facility] Pager [phone]. If 8PM-8AM, please contact night-coverage at [email or web address], password [facility] 06/28/2012, 4:27 PM  LOS: 3 days

## 2012-06-29 ENCOUNTER — Inpatient Hospital Stay (HOSPITAL_COMMUNITY): Payer: Medicare Other

## 2012-06-29 NOTE — Progress Notes (Signed)
[facility] PROGRESS NOTE  [name] Davidson RUE:[id number] DOB: [birth year] DOA: 06/25/2012 PCP: Pamelia Hoit, MD  Assessment/Plan: Principal Problem:  *Acute respiratory failure with hypoxia Active Problems:  Obstructive pneumonia - ? due to mass  HTN (hypertension)  Hypothyroidism  Dementia  Dehydration  Leukocytosis  Collapse of right lung-middle lobe  Tobacco use- remote      *78 year old female patient with multiple medical problems including dementia. She lives with her daughter. She had developed progressive shortness of breath within the past 24 hours and was initially taken to the emergency room at [facility] [place]. Chest x-ray performed there revealed infiltrates and the patient had been empirically started on IV antibiotics for community-acquired pneumonia. Patient was placed on 100% nonrebreather mask to maintain saturations greater than 92%. Because of this request was made to admit the patient to the step down unit. She was subsequently transferred to [place] directly to the step down unit where she was evaluated by the admitting physician. At time of admission patient was awake enough to deny any chest pain, nausea vomiting or abdominal pain. The daughter who accompanied the patient reports the patient has had productive cough over the past 4-6 weeks and has undergone 2 courses of antibiotic therapy from the primary care physician. Initial antibiotic with Zithromax. She was deemed stable enough to remain on the step down unit.       Assessment/Plan:  Principal Problem:  *Acute respiratory failure with hypoxia:  A) Collapse of right lung-middle lobe  *Clinically has improved markedly since yesterday. Now on room air maintaining saturations greater than 95%  *Has had recurrent pneumonias over the past 6 weeks and has failed outpatient Zithromax and other antibiotic  Continue antibiotics for a total of 3 weeks Will need home oxygen   B) Obstructive  pneumonia - ? due to mass  *As noted above patient has collapse of the right middle lobe of the lung. This is associated with mild dilatation of the trachea with opacification of the proximal right upper lobe bronchus and bronchus intermedius as well as the right middle lobe bronchi.  *Differential includes severe pneumonia versus obstructing malignancy  *Extensive discussion held by Dr. Phillips Odor with the patient's daughter including her concerns this is an underlying malignancy. Goals of care were addressed and patient is now a DO NOT RESUSCITATE.  *The daughter would like further workup to determine a definitive diagnosis, if possible, and would like to discuss further with the pulmonologist a consultation has been requested. Pulmonary medicine has evaluated the patient and their recommendations are as follows:  I ( Dr. Sung Amabile) spoke with patient's daughter over the phone. The CT findings and history of recurrent pneumonias are certainly worrisome for endobronchial tumor. We discussed potential risk and benefits of pursuing a definitive diagnosis with FOB. She would be at very high risk of respiratory deterioration if FOB were performed at this time. She would not be a candidate for any specific therapies for lung cancer if one were to be diagnosed. It is unlikely that FOB would be significantly therapeutic (ie. mucus removal). I have suggested the following plan:  1) I (Dr. Sung Amabile) have requested that the pt's daughter obtain recent CXR's from Dr Tawana Scale office for my review  2) No FOB for now  3) treat as PNA - Would transition from IV abx to Augmentin or moxifloxacin when able to take POs and complete 3 wks total for presumed post-obstructive PNA  4) Repeat CXR just prior to discharge and again @ time  of completion of abx course  5) consider repeat limited CT chest and/or FOB depending on status in 3-4 wks and depending on how much we (and daughter) feel that a definitive diagnosis is desired      Active Problems:  Dementia  *Has become progressive over the past 12 months  *Prior to admission the patient was ambulatory and able to dress herself with minimal assistance  *If able to swallow continue memantine and mirtzapine  Dehydration  *Likely related to acute pulmonary illness in the setting of a patient with dementia  *Urinary output is marginal and oral intake also limited  *Continue IV fluids at 75 cc per hour  Leukocytosis  *Related to pneumonia and dehydration  HTN (hypertension)  *Blood pressure controlled  *Continue Norvasc  Hypothyroidism  *Continue Synthroid  Tobacco use- remote  *Risk factor for pulmonary malignancy   DVT prophylaxis: Lovenox 40 mg subcutaneous  Code Status: Now is DO NOT RESUSCITATE  Family Communication: Daughter not available during our evaluation this morning    Disposition Plan: Discharge home tomorrow possibly with home oxygen Discuss with daughter [name] at [phone], awaiting PT OT eval   Consultants:  Pulmonary medicine  Procedures:  None  Antibiotics:  Zithromax 10/6 >>>  Rocephin 10/6 >>>     HPI/Subjective:  Patient conversant. Seems to be alert and oriented to self and place, Daughter at bedside. Extensive discussion held regarding suspected diagnosis and poor prognosis. Emotional support given     Objective: Filed Vitals:   06/28/12 2110 06/29/12 0612 06/29/12 0622 06/29/12 0944  BP: 122/65 124/74    Pulse: 90 90    Temp: 98.2 F (36.8 C) 97.7 F (36.5 C)    TempSrc: Oral Oral    Resp:  18    Height:      Weight:      SpO2: 88% 83% 93% 96%    Intake/Output Summary (Last 24 hours) at 06/29/12 1244 Last data filed at 06/29/12 0900  Gross per 24 hour  Intake   1440 ml  Output   2752 ml  Net  -1312 ml    Exam:  General: No acute respiratory distress  Lungs: Clear to auscultation bilaterally without wheezes or crackles-less diminished on right side and compared to yesterday 06/26/2012   Cardiovascular: Regular rate and rhythm without murmur gallop or rub normal S1 and S2  Abdomen: Nontender, nondistended, soft, bowel sounds positive, no rebound, no ascites, no appreciable mass  Extremities: No significant cyanosis, clubbing, or edema bilateral lower extremities    Data Reviewed: Basic Metabolic Panel:  Lab 06/26/12 8295 06/25/12 2331 06/25/12 1530  NA 140 -- 140  K 3.6 -- 3.4*  CL 102 -- 100  CO2 26 -- 26  GLUCOSE 97 -- 185*  BUN 9 -- 13  CREATININE 0.65 0.53 0.70  CALCIUM 9.3 -- 9.7  MG -- -- --  PHOS -- -- --    Liver Function Tests:  Lab 06/26/12 0500 06/25/12 1530  AST 26 21  ALT 7 6  ALKPHOS 65 80  BILITOT 0.3 0.4  PROT 6.6 8.2  ALBUMIN 3.3* 4.1   No results found for this basename: LIPASE:5,AMYLASE:5 in the last 168 hours No results found for this basename: AMMONIA:5 in the last 168 hours  CBC:  Lab 06/26/12 0500 06/25/12 2331 06/25/12 1530  WBC 9.8 11.8* 12.4*  NEUTROABS 5.8 -- 8.6*  HGB 14.7 16.3* 16.2*  HCT 42.5 46.7* 47.5*  MCV 93.8 93.0 93.5  PLT 208 201 244  Cardiac Enzymes:  Lab 06/25/12 2331  CKTOTAL --  CKMB --  CKMBINDEX --  TROPONINI <0.30   BNP (last 3 results)  Basename 06/25/12 2331  PROBNP 1662.0*     CBG: No results found for this basename: GLUCAP:5 in the last 168 hours  Recent Results (from the past 240 hour(s))  MRSA PCR SCREENING     Status: Normal   Collection Time   06/25/12  8:52 PM      Component Value Range Status Comment   MRSA by PCR NEGATIVE  NEGATIVE Final      Studies: Ct Chest Wo Contrast  06/26/2012  *RADIOLOGY REPORT*  Clinical Data: 78 year old female with acute respiratory failure. IV contrast allergy.  CT CHEST WITHOUT CONTRAST  Technique:  Multidetector CT imaging of the chest was performed following the standard protocol without IV contrast.  Comparison: Chest radiographs 06/25/2012 and earlier.  Findings: The trachea is mildly dilated and patent.  Mild impression on the main  left and right bronchi seems related to mediastinal vasculature.  The proximal right upper lobe bronchus and bronchus intermedius are opacified.  Right middle lobe bronchi are opacified proximally.  Right lower lobe bronchi are most severely opacified.  There is a mix of volume loss, pleural fluid, and some consolidation at the right lung base.  Paraseptal emphysema in the right upper lobe which remains clear. There is collapse of the medial segment of the right middle lobe. The lateral segment is clear except for scarring and some emphysema.  Scarring and paraseptal emphysema in the left lung.  Occasional calcified granulomas on the left.  Rightward shift of the mediastinum.  Calcified and ectatic visible aorta.  Ectatic great vessels.  No definite mediastinal lymphadenopathy.  Gallbladder surgically absent.  Other visualized upper abdominal viscera are within normal limits for age.  Multiple chronic-appearing thoracic compression fractures, those in the lower thoracic spine and upper lumbar spine have been previously augmented with bone cement.  No definite acute osseous abnormality.  IMPRESSION: 1.  Opacified or obstructed distal right mainstem bronchus and other right lung airways, especially the right lower lobe airways. This could be related to pneumonia or neoplasm. 2.  Combination of small pleural effusion, collapse, and consolidation in the right lower lobe.  Medial segment right middle lobe collapse. 3.  If right lung ventilation does not improve following pulmonary toilet, recommend bronchoscopy to exclude obstructing neoplasm.   Original Report Authenticated By: Harley Hallmark, M.D.    Dg Chest Portable 1 View  06/25/2012  *RADIOLOGY REPORT*  Clinical Data: Shortness of breath.  History of lung infection. Cold sweats.  History hypertension.  Kidney infection.  PORTABLE CHEST - 1 VIEW  Comparison: 09/21/2011  Findings: The heart is enlarged.  Aorta is tortuous.  There is patchy density within the right  lower lobe, most consistent with infectious infiltrate and atelectasis.  Elevation of the right hemidiaphragm is consistent with some volume loss.  There are mildly prominent interstitial markings.  Otherwise, the left lung is clear.  Patient has had previous vertebral plasty at multiple levels.  IMPRESSION:  1.  Cardiomegaly. 2.  Right lower lobe atelectasis and infiltrate.   Original Report Authenticated By: Patterson Hammersmith, M.D.     Scheduled Meds:   . albuterol  2.5 mg Nebulization Q6H  . amLODipine  5 mg Oral Daily  . azithromycin  500 mg Oral Q1500  . budesonide  0.5 mg Nebulization BID  . cefTRIAXone (ROCEPHIN) IVPB 1 gram/50 mL D5W  1 g  Intravenous Q24H  . enoxaparin (LOVENOX) injection  40 mg Subcutaneous Q24H  . feeding supplement  237 mL Oral BID BM  . gabapentin  300 mg Oral TID  . ipratropium  0.5 mg Nebulization Q6H  . levothyroxine  50 mcg Oral QAC breakfast  . memantine  10 mg Oral BID  . metoprolol succinate  25 mg Oral Daily  . mirtazapine  30 mg Oral QHS  . sodium chloride  3 mL Intravenous Q12H   Continuous Infusions:   . sodium chloride 75 mL/hr (06/27/12 2100)    Principal Problem:  *Acute respiratory failure with hypoxia Active Problems:  Obstructive pneumonia - ? due to mass  HTN (hypertension)  Hypothyroidism  Dementia  Dehydration  Leukocytosis  Collapse of right lung-middle lobe  Tobacco use- remote    Time spent: 40 minutes   [facility]  [facility] Pager [phone]. If 8PM-8AM, please contact night-coverage at [email or web address], password [facility] 06/29/2012, 12:44 PM  LOS: 4 days

## 2012-06-29 NOTE — Progress Notes (Signed)
I agree with the following treatment note after reviewing documentation.   Johnston, Patsie Mccardle Brynn   OTR/L Pager: [phone] Office: [phone] .   

## 2012-06-29 NOTE — Progress Notes (Signed)
Occupational Therapy Evaluation Patient Details Name: [name] Davidson MRN: [id number] DOB: 02-04-1922 Today's Date: 06/29/2012 Time: 4098-1191 OT Time Calculation (min): 20 min  OT Assessment / Plan / Recommendation Clinical Impression  Pt. 78 yo female admitted for SOB. Pt. very pleasant with baseline dementia. Pt. stated she is still driving and after OT evaluation, OT has determined she is not safe to be driving, MD may want to discuss with pt. and family. Could benefit from OT acutely to address energy conservation during ADL's.    OT Assessment  Patient needs continued OT Services    Follow Up Recommendations  Skilled nursing facility;Supervision/Assistance - 24 hour    Barriers to Discharge Other (comment) Currently lives with daughter who is also taking care of invalid husband and disabled daughter who only has one leg.   Equipment Recommendations  None recommended by OT    Recommendations for Other Services    Frequency  Min 2X/week    Precautions / Restrictions Precautions Precautions: Fall Precaution Comments: O2 Restrictions Weight Bearing Restrictions: No   Pertinent Vitals/Pain None reported    ADL  Grooming: Performed;Wash/dry hands;Supervision/safety Where Assessed - Grooming: Supported Copywriter, advertising: Performed;Min Pension scheme manager Method: Sit to Barista: Materials engineer and Hygiene: Performed;Independent Where Assessed - Toileting Clothing Manipulation and Hygiene: Sit to stand from 3-in-1 or toilet Equipment Used: Rolling walker Transfers/Ambulation Related to ADLs: Pt. supervision for transfers and ambulation, requires vc's for safe hand placement.  ADL Comments: Pt. O2 levels immediately down to 70 when standing on room air, had to put pt up to 3L in order to get stats back to 89. Notified RN and respiratory therapy. Pt. completed toileting with supervision level to bedside commode.  Pt. independent with hygiene and completed grooming tasks with supervision.     OT Diagnosis: Generalized weakness;Cognitive deficits  OT Problem List: Decreased activity tolerance;Decreased cognition;Decreased safety awareness;Other (comment) (SOB) OT Treatment Interventions: Self-care/ADL training;Energy conservation;DME and/or AE instruction;Patient/family education;Therapeutic exercise   OT Goals Acute Rehab OT Goals OT Goal Formulation: Patient unable to participate in goal setting Time For Goal Achievement: 07/13/12 Potential to Achieve Goals: Good ADL Goals Pt Will Perform Grooming: Sitting at sink;with set-up ADL Goal: Grooming - Progress: Goal set today Pt Will Perform Upper Body Bathing: with supervision;Sitting at sink ADL Goal: Upper Body Bathing - Progress: Goal set today Pt Will Perform Lower Body Bathing: with supervision;Sit to stand from chair ADL Goal: Lower Body Bathing - Progress: Goal set today Pt Will Perform Upper Body Dressing: with set-up;Sitting, chair ADL Goal: Upper Body Dressing - Progress: Goal set today Pt Will Perform Lower Body Dressing: with set-up;Sit to stand from chair ADL Goal: Lower Body Dressing - Progress: Goal set today  Visit Information  Last OT Received On: 06/29/12 Assistance Needed: +1    Subjective Data  Subjective: Am I going to [place]? - Referring to radiology coming to transport her.  Patient Stated Goal: None stated   Prior Functioning     Home Living Lives With: Daughter Available Help at Discharge: Family;Available 24 hours/day Type of Home: House Home Access: Stairs to enter [employer] of Steps: 4 Entrance Stairs-Rails: Right Home Layout: One level Bathroom Shower/Tub: Tub/shower unit Home Adaptive Equipment: Walker - rolling Prior Function Level of Independence: Independent Able to Take Stairs?: Yes Driving: Yes Vocation: Retired Comments: pt reports she helps with cooking and cleaning?  questionable historian Communication Communication: HOH Dominant Hand: Right  Vision/Perception     Cognition  Overall Cognitive Status: History of cognitive impairments - at baseline Area of Impairment: Memory Arousal/Alertness: Awake/alert Orientation Level: Oriented X4 / Intact Behavior During Session: [facility] for tasks performed Memory Deficits: baseline dementia, although able to answer all orientation questions correctly this session.     Extremity/Trunk Assessment Right Upper Extremity Assessment RUE ROM/Strength/Tone: Deficits Left Upper Extremity Assessment LUE ROM/Strength/Tone: Deficits Right Lower Extremity Assessment RLE ROM/Strength/Tone: Deficits RLE ROM/Strength/Tone Deficits: grossly 4/5 Left Lower Extremity Assessment LLE ROM/Strength/Tone: Deficits LLE ROM/Strength/Tone Deficits: grossly 4/5 Trunk Assessment Trunk Assessment: Kyphotic     Mobility Bed Mobility Bed Mobility: Sit to Supine Supine to Sit: 5: Supervision Sitting - Scoot to Edge of Bed: 4: Min assist Sit to Supine: 5: Supervision;HOB flat Details for Bed Mobility Assistance: cues for ease of movement, use of pad to assist with scooting EOB Transfers Transfers: Sit to Stand;Stand to Sit Sit to Stand: 4: Min guard;With upper extremity assist;From chair/3-in-1;With armrests Stand to Sit: 4: Min guard;With armrests;With upper extremity assist;To bed;To chair/3-in-1 Details for Transfer Assistance: cues for safe sequencing and hand placement.     Shoulder Instructions     Exercise     Balance Static Standing Balance Static Standing - Balance Support: Bilateral upper extremity supported Static Standing - Level of Assistance: 4: Min assist   End of Session OT - End of Session Activity Tolerance: Patient tolerated treatment well Patient left: in bed;with call bell/phone within reach;Other (comment) (Radiology in room to transport) Nurse Communication: Mobility status;Other (comment)  (O2 stats drop to 70 on room air when standing from chair)  GO     Cleora Fleet 06/29/2012, 3:33 PM

## 2012-06-29 NOTE — Progress Notes (Signed)
RT placed patient on VM 50% per sat 89%.

## 2012-06-29 NOTE — Progress Notes (Addendum)
SUBJ: Looks much better than she did during my initial eval. Talking on the phone with no overt dyspnea   OBJ:  Filed Vitals:   06/29/12 0612  BP: 124/74  Pulse: 90  Temp: 97.7 F (36.5 C)  Resp: 18   97% 2lpm [place]  Gen: no overt respiratory distress  HEENT: WNL  Neck: noJVD  Lungs: diminished in R base, no wheezes  Cardiovascular: RRR s M  Abdomen: NABS  Ext: No edema, warm  Neuro: no focal deficits   No new cxr   IMP: Acute respiratory failure with hypoxia Possible post obstructive pneumonia   Concern for bronchus intermedius endobronchial tumor will RML/RLL obstruction Collapse of right lung-middle/lower lobes Tobacco use- remote  PLAN  Recheck CXR today and again in 3-4 wks Complete abx If, after further consideration, a definitive diagnosis is desired (recognizing the limited treatment options that would be available) reconsult Pulmonary Medicine as outpt to address diagnostic options  I will review CXR in AM but might not be able to get by to see her prior to discharge. Please call me if there are any questions   Billy Fischer, MD ; [facility] service [place] [phone].  After 5:30 PM or weekends, call [phone]

## 2012-06-29 NOTE — Evaluation (Signed)
Physical Therapy Evaluation Patient Details Name: Sidonie Dexheimer MRN: [id number] DOB: [birth year] Today's Date: 06/29/2012 Time: 1329-1400 PT Time Calculation (min): 31 min  PT Assessment / Plan / Recommendation Clinical Impression  Ms. Roig is 78 y/o female admitted with SOB and ARF. Presents to PT today with generalized weakness and decreased activity tolerance limiting her independence with mobility and ADLs. Will benefit physical therapy in the acute setting to address these and the below deficits so as to maximize function and decrease BOC at home. Rec HHPT and 24 hour supervision on d/c.     PT Assessment  Patient needs continued PT services    Follow Up Recommendations  Home health PT;Supervision/Assistance - 24 hour    Does the patient have the potential to tolerate intense rehabilitation      Barriers to Discharge        Equipment Recommendations  None recommended by PT    Recommendations for Other Services OT consult   Frequency Min 3X/week    Precautions / Restrictions Precautions Precautions: Fall Precaution Comments: O2 Restrictions Weight Bearing Restrictions: No         Mobility  Bed Mobility Bed Mobility: Supine to Sit;Sitting - Scoot to Edge of Bed Supine to Sit: 5: Supervision Sitting - Scoot to [place] of Bed: 4: Min assist Details for Bed Mobility Assistance: cues for ease of movement, use of pad to assist with scooting EOB Transfers Transfers: Sit to Stand;Stand to Sit Sit to Stand: 4: Min assist;With upper extremity assist;From bed Stand to Sit: 4: Min assist;With upper extremity assist;To chair/3-in-1;To bed;With armrests Details for Transfer Assistance: verbal cues for sequencing, min facilitation at hips for power up and stabilization in standing; cues for hand placement and guidance of hips to chair Ambulation/Gait Ambulation/Gait Assistance: 4: Min assist Ambulation Distance (Feet): 90 Feet Assistive device: Rolling walker Ambulation/Gait  Assistance Details: cues for tall posture and safe maneuvering of RW Gait Pattern: Step-through pattern;Trunk flexed;Shuffle Stairs: No Wheelchair Mobility Wheelchair Mobility: No              PT Diagnosis: Difficulty walking;Abnormality of gait;Generalized weakness  PT Problem List: Decreased strength;Decreased activity tolerance;Decreased balance;Decreased mobility;Cardiopulmonary status limiting activity PT Treatment Interventions: DME instruction;Gait training;Stair training;Functional mobility training;Therapeutic activities;Therapeutic exercise;Balance training;Neuromuscular re-education;Patient/family education   PT Goals Acute Rehab PT Goals PT Goal Formulation: With patient Time For Goal Achievement: 07/06/12 Potential to Achieve Goals: Good Pt will go Supine/Side to Sit: with supervision PT Goal: Supine/Side to Sit - Progress: Goal set today Pt will go Sit to Supine/Side: with supervision PT Goal: Sit to Supine/Side - Progress: Goal set today Pt will go Sit to Stand: with supervision PT Goal: Sit to Stand - Progress: Goal set today Pt will go Stand to Sit: with supervision PT Goal: Stand to Sit - Progress: Goal set today Pt will Transfer Bed to Chair/Chair to Bed: with supervision Pt will Ambulate: 51 - 150 feet;with supervision;with rolling walker PT Goal: Ambulate - Progress: Goal set today Pt will Go Up / Down Stairs: 3-5 stairs;with min assist;with rail(s) PT Goal: Up/Down Stairs - Progress: Goal set today  Visit Information  Last PT Received On: 06/29/12 Assistance Needed: +1 (+2 for lines/O2)    Subjective Data  Subjective: What is in my lungs?  Patient Stated Goal: home   Prior Functioning  Home Living Lives With: Daughter Available Help at Discharge: Family;Available 24 hours/day Type of Home: House Home Access: Stairs to enter [employer] of Steps: 4 Entrance Stairs-Rails: Right Home Layout: One  level Home Adaptive Equipment: Walker -  rolling Prior Function Level of Independence: Independent Able to Take Stairs?: Yes Driving: Yes Vocation: Retired Comments: pt reports she helps with cooking and cleaning? questionable historian Communication Communication: HOH    Cognition  Overall Cognitive Status: Impaired Area of Impairment: Memory Arousal/Alertness: Awake/alert Orientation Level: Appears intact for tasks assessed Behavior During Session: [facility] for tasks performed Memory Deficits: baseline dementia, asks the same question about her lungs 3 times during the session    Extremity/Trunk Assessment Right Upper Extremity Assessment RUE ROM/Strength/Tone: Deficits Left Upper Extremity Assessment LUE ROM/Strength/Tone: Deficits Right Lower Extremity Assessment RLE ROM/Strength/Tone: Deficits RLE ROM/Strength/Tone Deficits: grossly 4/5 Left Lower Extremity Assessment LLE ROM/Strength/Tone: Deficits LLE ROM/Strength/Tone Deficits: grossly 4/5   Balance Static Standing Balance Static Standing - Balance Support: Bilateral upper extremity supported Static Standing - Level of Assistance: 4: Min assist  End of Session PT - End of Session Equipment Utilized During Treatment: Gait belt Activity Tolerance: Patient tolerated treatment well Patient left: in chair;with call bell/phone within reach Nurse Communication: Mobility status  GP     [facility] HELEN 06/29/2012, 2:59 PM

## 2012-06-30 MED ORDER — LEVOFLOXACIN 500 MG PO TABS: 500.0000 mg | ORAL_TABLET | Freq: Every day | ORAL | Status: AC

## 2012-06-30 MED ORDER — IPRATROPIUM BROMIDE HFA 17 MCG/ACT IN AERS: 2.0000 | INHALATION_SPRAY | Freq: Four times a day (QID) | RESPIRATORY_TRACT | Status: DC

## 2012-06-30 MED ORDER — ALBUTEROL SULFATE (5 MG/ML) 0.5% IN NEBU: 2.5000 mg | INHALATION_SOLUTION | Freq: Three times a day (TID) | RESPIRATORY_TRACT | Status: DC

## 2012-06-30 MED ORDER — ALBUTEROL SULFATE (5 MG/ML) 0.5% IN NEBU: 2.5000 mg | INHALATION_SOLUTION | Freq: Four times a day (QID) | RESPIRATORY_TRACT | Status: DC

## 2012-06-30 MED ORDER — ALBUTEROL SULFATE HFA 108 (90 BASE) MCG/ACT IN AERS: 2.0000 | INHALATION_SPRAY | Freq: Four times a day (QID) | RESPIRATORY_TRACT | Status: DC | PRN

## 2012-06-30 MED ORDER — IPRATROPIUM BROMIDE 0.02 % IN SOLN: 0.5000 mg | Freq: Three times a day (TID) | RESPIRATORY_TRACT | Status: DC

## 2012-06-30 NOTE — Discharge Summary (Signed)
Physician Discharge Summary  [name] Davidson MRN: [id number] DOB/AGE: 1922-04-01 78 y.o.  PCP: Pamelia Hoit, MD   Admit date: 06/25/2012 Discharge date: 06/30/2012  Discharge Diagnoses:     *Acute respiratory failure with hypoxia    Obstructive pneumonia - ? due to mass  HTN (hypertension)  Hypothyroidism  Dementia  Dehydration  Leukocytosis  Collapse of right lung-middle lobe  Tobacco use- remote     Medication List     As of 06/30/2012 12:40 PM    TAKE these medications         albuterol 108 (90 BASE) MCG/ACT inhaler   Commonly known as: PROVENTIL HFA;VENTOLIN HFA   Inhale 2 puffs into the lungs every 6 (six) hours as needed for wheezing.      amLODipine 5 MG tablet   Commonly known as: NORVASC   Take 5 mg by mouth daily.      gabapentin 300 MG capsule   Commonly known as: NEURONTIN   Take 300 mg by mouth at bedtime.      HYDROcodone-acetaminophen 10-325 MG per tablet   Commonly known as: NORCO   Take 1-2 tablets by mouth every 6 (six) hours as needed. For pain      ipratropium 17 MCG/ACT inhaler   Commonly known as: ATROVENT HFA   Inhale 2 puffs into the lungs every 6 (six) hours.      levofloxacin 500 MG tablet   Commonly known as: LEVAQUIN   Take 1 tablet (500 mg total) by mouth daily.      levothyroxine 50 MCG tablet   Commonly known as: SYNTHROID, LEVOTHROID   Take 50 mcg by mouth at bedtime.      memantine 10 MG tablet   Commonly known as: NAMENDA   Take 10 mg by mouth 2 (two) times daily.      methocarbamol 500 MG tablet   Commonly known as: ROBAXIN   Take 500 mg by mouth every morning.      metoprolol succinate 25 MG 24 hr tablet   Commonly known as: TOPROL-XL   Take 25 mg by mouth daily.      mirtazapine 30 MG tablet   Commonly known as: REMERON   Take 30 mg by mouth at bedtime.      OVER THE COUNTER MEDICATION   Take 1 tablet by mouth daily. Protandim Supplement        Discharge Condition: Chronically ill but  stable  Disposition: 01-Home or Self Care   Consults:  #1 pulmonary   Significant Diagnostic Studies: Dg Chest 2 View  06/29/2012  *RADIOLOGY REPORT*  Clinical Data: Follow-up evaluation of pneumonia.  CHEST - 2 VIEW  Comparison: Chest x-ray 06/25/2012.  Findings: There is persistent volume loss and atelectasis in the right middle and lower lobes.  Elevation of the right hemidiaphragm is unchanged.  Probable small right pleural effusion.  Left lung appears relatively clear.  Heart size is within normal limits. The patient is rotated to the right on today's exam, resulting in distortion of the mediastinal contours and reduced diagnostic sensitivity and specificity for mediastinal pathology. Atherosclerosis in the thoracic aorta.  Post vertebroplasty changes in the lower thoracic spine.  IMPRESSION: 1.  Allowing for slight differences in patient positioning, the radiographic appearance of the chest is very similar to prior examinations, demonstrating persistent atelectasis and consolidation in the right middle and lower lobes, as above. Continued attention on follow-up studies is recommended to ensure resolution of these findings.   Original Report Authenticated  By: Florencia Reasons, M.D.    Ct Chest Wo Contrast  06/26/2012  *RADIOLOGY REPORT*  Clinical Data: 78 year old female with acute respiratory failure. IV contrast allergy.  CT CHEST WITHOUT CONTRAST  Technique:  Multidetector CT imaging of the chest was performed following the standard protocol without IV contrast.  Comparison: Chest radiographs 06/25/2012 and earlier.  Findings: The trachea is mildly dilated and patent.  Mild impression on the main left and right bronchi seems related to mediastinal vasculature.  The proximal right upper lobe bronchus and bronchus intermedius are opacified.  Right middle lobe bronchi are opacified proximally.  Right lower lobe bronchi are most severely opacified.  There is a mix of volume loss, pleural fluid,  and some consolidation at the right lung base.  Paraseptal emphysema in the right upper lobe which remains clear. There is collapse of the medial segment of the right middle lobe. The lateral segment is clear except for scarring and some emphysema.  Scarring and paraseptal emphysema in the left lung.  Occasional calcified granulomas on the left.  Rightward shift of the mediastinum.  Calcified and ectatic visible aorta.  Ectatic great vessels.  No definite mediastinal lymphadenopathy.  Gallbladder surgically absent.  Other visualized upper abdominal viscera are within normal limits for age.  Multiple chronic-appearing thoracic compression fractures, those in the lower thoracic spine and upper lumbar spine have been previously augmented with bone cement.  No definite acute osseous abnormality.  IMPRESSION: 1.  Opacified or obstructed distal right mainstem bronchus and other right lung airways, especially the right lower lobe airways. This could be related to pneumonia or neoplasm. 2.  Combination of small pleural effusion, collapse, and consolidation in the right lower lobe.  Medial segment right middle lobe collapse. 3.  If right lung ventilation does not improve following pulmonary toilet, recommend bronchoscopy to exclude obstructing neoplasm.   Original Report Authenticated By: Harley Hallmark, M.D.    Dg Chest Portable 1 View  06/25/2012  *RADIOLOGY REPORT*  Clinical Data: Shortness of breath.  History of lung infection. Cold sweats.  History hypertension.  Kidney infection.  PORTABLE CHEST - 1 VIEW  Comparison: 09/21/2011  Findings: The heart is enlarged.  Aorta is tortuous.  There is patchy density within the right lower lobe, most consistent with infectious infiltrate and atelectasis.  Elevation of the right hemidiaphragm is consistent with some volume loss.  There are mildly prominent interstitial markings.  Otherwise, the left lung is clear.  Patient has had previous vertebral plasty at multiple levels.   IMPRESSION:  1.  Cardiomegaly. 2.  Right lower lobe atelectasis and infiltrate.   Original Report Authenticated By: Patterson Hammersmith, M.D.      Microbiology: Recent Results (from the past 240 hour(s))  MRSA PCR SCREENING     Status: Normal   Collection Time   06/25/12  8:52 PM      Component Value Range Status Comment   MRSA by PCR NEGATIVE  NEGATIVE Final      Labs: No results found for this or any previous visit (from the past 48 hour(s)).   HPI 77 year old female patient with multiple medical problems including dementia. She lives with her daughter. She had developed progressive shortness of breath within the past 24 hours and was initially taken to the emergency room [facility]. Chest x-ray performed there revealed infiltrates and the patient had been empirically started on IV antibiotics for community-acquired pneumonia. Patient was placed on 100% nonrebreather mask to maintain saturations greater  than 92%. Because of this request was made to admit the patient to the step down unit. She was subsequently transferred to [place] directly to the step down unit where she was evaluated by the admitting physician. At time of admission patient was awake enough to deny any chest pain, nausea vomiting or abdominal pain. The daughter who accompanied the patient reports the patient has had productive cough over the past 4-6 weeks and has undergone 2 courses of antibiotic therapy from the primary care physician. Initial antibiotic with Zithromax. She was deemed stable enough to remain on the step down unit.   Assessment/Plan:  Principal Problem:  *Acute respiratory failure with hypoxia:  A) Collapse of right lung-middle lobe  Initial CT scan showed opacified and obstructed distal right mainstem bronchus, consolidation in the right lower lobe,*pulmonary consultation was obtained, and DC the recommendations below,*Has had recurrent pneumonias over the past 6 weeks and has failed  outpatient Zithromax and other antibiotic s  pulmonology recommended toContinue antibiotics for a total of 3 weeks  Will need home oxygen 3 L per minute This was conveyed to the daughter prior to discharge Not a candidate for fiberoptic bronchoscopy,  B) Obstructive pneumonia - ? due to mass  *As noted above patient has collapse of the right middle lobe of the lung. This is associated with mild dilatation of the trachea with opacification of the proximal right upper lobe bronchus and bronchus intermedius as well as the right middle lobe bronchi.  *Differential includes severe pneumonia versus obstructing malignancy   *Extensive discussion held by Dr. Phillips Odor with the patient's daughter including her concerns this is an underlying malignancy. Goals of care were addressed and patient is now a DO NOT RESUSCITATE.    *The daughter would like further workup to determine a definitive diagnosis, if possible, and would like to discuss further with the pulmonologist a consultation and a consultation was requested.   Pulmonary medicine has evaluated the patient and their recommendations are as follows:  I ( Dr. Sung Amabile) spoke with patient's daughter over the phone. The CT findings and history of recurrent pneumonias are certainly worrisome for endobronchial tumor. We discussed potential risk and benefits of pursuing a definitive diagnosis with FOB. She would be at very high risk of respiratory deterioration if FOB were performed at this time. She would not be a candidate for any specific therapies for lung cancer if one were to be diagnosed. It is unlikely that FOB would be significantly therapeutic (ie. mucus removal). Pulmonary suggested the following plan:  1) I (Dr. Sung Amabile) have requested that the pt's daughter obtain recent CXR's from Dr Tawana Scale office for my review  2) No FOB for now   3) treat as PNA - Would transition from IV abx to Augmentin or moxifloxacin when able to take POs and complete 3  wks total for presumed post-obstructive PNA   4) Repeat CXR just prior to discharge was done and showed radiographic appearance of the chest is very similar to prior examinations, demonstrating persistent atelectasis and consolidation in the right middle and lower lobes,and again @ time of completion of abx course , to be arranged for by the primary care provider   5) we could consider repeat limited CT chest and/or FOB depending on status in 3-4 wks and depending on how much we (and daughter) feel that a definitive diagnosis is desired , followup consultation with Dr. Bard Herbert, in 3 weeks   Dementia  *Has become progressive over the past 12 months  *  Prior to admission the patient was ambulatory and able to dress herself with minimal assistance  continue memantine and mirtzapine Physical occupational therapy consultation was obtained, home physical therapy home PT as recommended Discussed with the daughter would like to have the patient come back home instead of a nursing home   Dehydration  *Likely related to acute pulmonary illness in the setting of a patient with dementia  She was hydrated with IV fluids with good resolution   Leukocytosis/improving  *Related to pneumonia and dehydration   HTN (hypertension)  *Blood pressure controlled  *Continue Norvasc   Hypothyroidism  *Continue Synthroid   Tobacco use- remote  *Risk factor for pulmonary malignancy    DVT prophylaxis: Treated with Lovenox 40 mg subcutaneous  Code Status: Now is DO NOT RESUSCITATE  Family Communication: Daughter not available during our evaluation this morning  Disposition Plan: Discharge home tomorrow possibly with home oxygen  Discuss with daughter [name] Davidson at [phone], awaiting PT OT eval    Discharge Exam:  Blood pressure 139/60, pulse 97, temperature 98.3 F (36.8 C), temperature source Axillary, resp. rate 18, height 5\' 5"  (1.651 m), weight 49.5 kg (109 lb 2 oz), SpO2 95.00%.  Gen: no  overt respiratory distress  HEENT: WNL  Neck: noJVD  Lungs: diminished in R base, no wheezes  Cardiovascular: RRR s M  Abdomen: NABS  Ext: No edema, warm  Neuro: no focal deficits        Signed: Leonel Mccollum 06/30/2012, 12:40 PM

## 2012-06-30 NOTE — Progress Notes (Signed)
Physical Therapy Treatment Patient Details Name: [name] Davidson MRN: [id number] DOB: 1921/10/06 Today's Date: 06/30/2012 Time: 4098-1191 PT Time Calculation (min): 33 min  PT Assessment / Plan / Recommendation Comments on Treatment Session  Pt scheduled for d/c home today with family. Pt ambulated on RA with sats at 92%. Pt does present with balance deficits and requires constant Supervision with mobility for safety. Pt is a high fall risk without supervision. Recommend HHPT to focus on increasing independence and mobility.    Follow Up Recommendations  Home health PT     Does the patient have the potential to tolerate intense rehabilitation     Barriers to Discharge        Equipment Recommendations  None recommended by PT    Recommendations for Other Services    Frequency Min 3X/week   Plan Discharge plan remains appropriate    Precautions / Restrictions Precautions Precautions: Fall Precaution Comments: Kyphotic posture Restrictions Weight Bearing Restrictions: No   Pertinent Vitals/Pain     Mobility  Bed Mobility Bed Mobility: Supine to Sit Supine to Sit: 5: Supervision Sitting - Scoot to Edge of Bed: 5: Supervision Transfers Transfers: Sit to Stand Sit to Stand: 5: Supervision Stand to Sit: 5: Supervision Details for Transfer Assistance: cues to turn completely around to increase safety, a little unbalanced at initial sit to stand but able to self-correct. Ambulation/Gait Ambulation/Gait Assistance: 5: Supervision Ambulation Distance (Feet): 125 Feet Assistive device: Rolling walker Ambulation/Gait Assistance Details: decreased safety awareness with walker placement. Pt tends to push RW too far forward. Pt able to correct with cues, but then slowly goes back to unsafe use of RW. Pt needs constsnt reminder for safety due to menory impairments. Gait Pattern: Step-through pattern;Decreased stride length Gait velocity: decreased Stairs: No (pt declined)      Exercises     PT Diagnosis:    PT Problem List:   PT Treatment Interventions:     PT Goals Acute Rehab PT Goals PT Goal: Supine/Side to Sit - Progress: Met PT Goal: Sit to Supine/Side - Progress: Met PT Goal: Sit to Stand - Progress: Progressing toward goal PT Goal: Stand to Sit - Progress: Progressing toward goal PT Goal: Ambulate - Progress: Met  Visit Information  Last PT Received On: 06/30/12 Assistance Needed: +1    Subjective Data  Subjective: It feels good to walk.   Cognition  Overall Cognitive Status: History of cognitive impairments - at baseline Area of Impairment: Memory Arousal/Alertness: Awake/alert Orientation Level: Appears intact for tasks assessed Behavior During Session: [name] for tasks performed    Balance  Static Standing Balance Static Standing - Level of Assistance: 5: Stand by assistance  End of Session PT - End of Session Equipment Utilized During Treatment: Gait belt Activity Tolerance: Patient tolerated treatment well Patient left: in bed Nurse Communication: Mobility status;Other (comment) (pt ok for d/c home with family providing 24 Hour and HHPT)   GP     Greggory Stallion 06/30/2012, 1:46 PM

## 2012-06-30 NOTE — Care Management Note (Signed)
  Page 2 of 2   06/30/2012     2:42:41 PM   CARE MANAGEMENT NOTE 06/30/2012  Patient:  [facility]   Account Number:  [id number]  Date Initiated:  06/30/2012  Documentation initiated by:  Ronny Flurry  Subjective/Objective Assessment:   DX: PNA , lives with daughter     Action/Plan:   home oxygen   Anticipated DC Date:  06/30/2012   Anticipated DC Plan:  HOME W HOME HEALTH SERVICES         Choice offered to / List presented to:  C-1 Patient   DME arranged  [facility]        HH arranged  HH-2 PT      HH agency  [facility]   Status of service:  Completed, signed off Medicare Important Message given?   (If response is "NO", the following Medicare IM given date fields will be blank) Date Medicare IM given:   Date Additional Medicare IM given:    Discharge Disposition:    Per UR Regulation:  Reviewed for med. necessity/level of care/duration of stay  If discussed at Long Length of Stay Meetings, dates discussed:    Comments:  06-30-12 Discussed home health with patient and patient's daughter Dois Davenport, they have used [facility] in past and would like [facility].  Facesheet information correct.  Referral given to [facility]  Ronny Flurry RN BSN [phone]

## 2012-06-30 NOTE — Progress Notes (Signed)
SATURATION QUALIFICATIONS:  Patient Saturations on Room Air at Rest = 95%  Patient Saturations on Room Air while Ambulating = 86%  Patient Saturations on 3 Liters of oxygen while Ambulating = 99%  Statement of medical necessity for home oxygen:

## 2012-06-30 NOTE — Progress Notes (Deleted)
Physical Therapy Treatment Patient Details Name: [name] Davidson MRN: [id number] DOB: [birth year] Today's Date: 06/30/2012 Time: 4098-1191 PT Time Calculation (min): 33 min  PT Assessment / Plan / Recommendation Comments on Treatment Session  Pt scheduled for d/c home today with family. Pt ambulated on RA with sats at 92%. Pt does present with balance deficits and requires constant Supervision with mobility for safety. Pt is a high fall risk without supervision. Recommend HHPT to focus on increasing independence and mobility.    Follow Up Recommendations  Home health PT     Does the patient have the potential to tolerate intense rehabilitation     Barriers to Discharge        Equipment Recommendations  None recommended by PT    Recommendations for Other Services    Frequency Min 3X/week   Plan Discharge plan remains appropriate    Precautions / Restrictions Precautions Precautions: Fall Precaution Comments: Kyphotic posture Restrictions Weight Bearing Restrictions: No   Pertinent Vitals/Pain     Mobility  Bed Mobility Bed Mobility: Supine to Sit Supine to Sit: 5: Supervision Sitting - Scoot to Edge of Bed: 5: Supervision Transfers Transfers: Sit to Stand Sit to Stand: 5: Supervision Stand to Sit: 5: Supervision Details for Transfer Assistance: cues to turn completely around to increase safety, a little unbalanced at initial sit to stand but able to self-correct. Ambulation/Gait Ambulation/Gait Assistance: 5: Supervision Ambulation Distance (Feet): 125 Feet Assistive device: Rolling walker Ambulation/Gait Assistance Details: decreased safety awareness with walker placement. Pt tends to push RW too far forward. Pt able to correct with cues, but then slowly goes back to unsafe use of RW. Pt needs constsnt reminder for safety due to menory impairments. Gait Pattern: Step-through pattern;Decreased stride length Gait velocity: decreased Stairs: No (pt declined)      Exercises     PT Diagnosis:    PT Problem List:   PT Treatment Interventions:     PT Goals Acute Rehab PT Goals PT Goal: Supine/Side to Sit - Progress: Met PT Goal: Sit to Supine/Side - Progress: Met PT Goal: Sit to Stand - Progress: Progressing toward goal PT Goal: Stand to Sit - Progress: Progressing toward goal PT Goal: Ambulate - Progress: Met  Visit Information  Last PT Received On: 06/30/12 Assistance Needed: +1    Subjective Data  Subjective: It feels good to walk.   Cognition  Overall Cognitive Status: History of cognitive impairments - at baseline Area of Impairment: Memory Arousal/Alertness: Awake/alert Orientation Level: Appears intact for tasks assessed Behavior During Session: [facility] for tasks performed    Balance  Static Standing Balance Static Standing - Level of Assistance: 5: Stand by assistance  End of Session PT - End of Session Equipment Utilized During Treatment: Gait belt Activity Tolerance: Patient tolerated treatment well Patient left: in bed Nurse Communication: Mobility status;Other (comment) (pt ok for d/c home with family providing 24 Hour and HHPT)   GP     Greggory Stallion 06/30/2012, 1:48 PM

## 2012-06-30 NOTE — Progress Notes (Signed)
Nutrition Follow-up  Intervention:   1.  Supplements; continue Ensure Complete BID 2.  Modify diet; consider liberalization to Regular diet as pt does not strictly follow a Low Sodium diet at home  Assessment:   Pt states 'I would eat more if I could get the foods I'm used to.'  RD obtained food preferences and will assist in ordering meals she prefers.   Pt lives at home with daughter.  Diet Order:  Low Sodium  Meds: Scheduled Meds:   . albuterol  2.5 mg Nebulization Q6H  . amLODipine  5 mg Oral Daily  . azithromycin  500 mg Oral Q1500  . budesonide  0.5 mg Nebulization BID  . cefTRIAXone (ROCEPHIN) IVPB 1 gram/50 mL D5W  1 g Intravenous Q24H  . enoxaparin (LOVENOX) injection  40 mg Subcutaneous Q24H  . feeding supplement  237 mL Oral BID BM  . gabapentin  300 mg Oral TID  . ipratropium  0.5 mg Nebulization Q6H  . levothyroxine  50 mcg Oral QAC breakfast  . memantine  10 mg Oral BID  . metoprolol succinate  25 mg Oral Daily  . mirtazapine  30 mg Oral QHS  . sodium chloride  3 mL Intravenous Q12H   Continuous Infusions:   . sodium chloride 20 mL/hr (06/29/12 1832)   PRN Meds:.acetaminophen, acetaminophen, albuterol, HYDROcodone-acetaminophen, methocarbamol, ondansetron (ZOFRAN) IV, ondansetron  Labs:  CMP     Component Value Date/Time   NA 140 06/26/2012 0500   K 3.6 06/26/2012 0500   CL 102 06/26/2012 0500   CO2 26 06/26/2012 0500   GLUCOSE 97 06/26/2012 0500   BUN 9 06/26/2012 0500   CREATININE 0.65 06/26/2012 0500   CALCIUM 9.3 06/26/2012 0500   PROT 6.6 06/26/2012 0500   ALBUMIN 3.3* 06/26/2012 0500   AST 26 06/26/2012 0500   ALT 7 06/26/2012 0500   ALKPHOS 65 06/26/2012 0500   BILITOT 0.3 06/26/2012 0500   GFRNONAA 77* 06/26/2012 0500   GFRAA 89* 06/26/2012 0500     Intake/Output Summary (Last 24 hours) at 06/30/12 1540 Last data filed at 06/30/12 1300  Gross per 24 hour  Intake     50 ml  Output   1375 ml  Net  -1325 ml    Weight Status:  No new wt.  New wt not  obtained- pt lying under several blankets.  Re-estimated needs:  1240-1385 kcal, 50-60g protein, 1.3-1.4 L/day fluid  Nutrition Dx:  Inadequate oral intake, ongoing  Monitor:   1. Food/Beverage; pt to consume >/=50% of meals consistently with continued supplement intake. 2. Wt/wt change; monitor trends.   Loyce Dys, MS RD LDN Clinical Inpatient Dietitian Pager: [phone] Weekend/After hours pager: [phone]

## 2012-07-19 ENCOUNTER — Inpatient Hospital Stay: Payer: BC Managed Care – PPO | Admitting: Internal Medicine

## 2012-07-20 ENCOUNTER — Encounter (HOSPITAL_COMMUNITY): Payer: BC Managed Care – PPO

## 2012-07-20 ENCOUNTER — Encounter: Payer: BC Managed Care – PPO | Admitting: Internal Medicine

## 2012-07-20 NOTE — Progress Notes (Signed)
 This encounter was created in error - please disregard.

## 2012-07-21 ENCOUNTER — Telehealth: Payer: Self-pay | Admitting: Internal Medicine

## 2012-07-21 NOTE — Telephone Encounter (Signed)
ATC x 1. NA. No machine to leave a msg. WCB.

## 2012-07-21 NOTE — Telephone Encounter (Signed)
Spoke with Patients daughter Dois Davenport in regards to [employer]. Expressed understanding, will f/u with PCP or go to ER. Patient keeping appt with MW 07/25/12 at 1:45.

## 2012-07-21 NOTE — Telephone Encounter (Signed)
PT HAS BEEN RESCHEDULED FOR HER HFU ON TUES., 11/5@ 1:45. THE DAUGHTER STATES THAT THE PT HAS BEEN MORE LETHARGIC FOR THE PAST 2 DAYS, SHE IS NOT EATING WELL AND IS CONST[PATED. HER BOWELS HAVE NOT MOVED IN 3-4 DAYS. MW, PLS ADVISE. Allergies  Allergen Reactions  . Sulfa Drugs Cross Reactors Other (See Comments)    unknown  . Contrast Media (Iodinated Diagnostic Agents) Rash

## 2012-07-21 NOTE — Telephone Encounter (Signed)
Daughter returned call. [name] Davidson  °

## 2012-07-21 NOTE — Telephone Encounter (Signed)
We are addressing just her resp status in this clinic - we have Dr Benedetto Goad listed as her primary and he should be contacted for these issues or if condition worsens significantly and can't get in to see Dr Andrey Campanile about them may need to return to ER

## 2012-07-25 ENCOUNTER — Encounter: Payer: Self-pay | Admitting: Internal Medicine

## 2012-07-25 ENCOUNTER — Ambulatory Visit (INDEPENDENT_AMBULATORY_CARE_PROVIDER_SITE_OTHER): Payer: Medicare Other | Admitting: Internal Medicine

## 2012-07-25 ENCOUNTER — Ambulatory Visit (INDEPENDENT_AMBULATORY_CARE_PROVIDER_SITE_OTHER)
Admission: RE | Admit: 2012-07-25 | Discharge: 2012-07-25 | Disposition: A | Discharge status: Home / Self Care | Payer: Medicare Other | Source: Ambulatory Visit | Attending: Internal Medicine | Admitting: Internal Medicine

## 2012-07-25 VITALS — BP 110/80 | HR 73 | Temp 97.6°F | Ht 63.0 in | Wt 114.0 lb

## 2012-07-25 DIAGNOSIS — J9811 Atelectasis: Secondary | ICD-10-CM

## 2012-07-25 DIAGNOSIS — J9819 Other pulmonary collapse: Secondary | ICD-10-CM

## 2012-07-25 DIAGNOSIS — J189 Pneumonia, unspecified organism: Secondary | ICD-10-CM

## 2012-07-25 NOTE — Assessment & Plan Note (Signed)
I had an extended discussion with the patient today lasting 15 to 20 minutes of a 25 minute visit on the following issues:  Although her cxr is no normalized, there is marked improvement aeration RML and RLL with decreased pleural changes as well, so hopefully this is just a slow to resolve pneumonia in a very frail eledelry lady with dementia and pos risk for aspiration  Discussed in detail all the  indications, usual  risks and alternatives  relative to the benefits with patient's daugther who agrees to proceed with conservative f/u.Marland Kitchen

## 2012-07-25 NOTE — Progress Notes (Signed)
Subjective:     Patient ID: [name] Davidson, female   DOB: 10-15-1921   MRN: [id number]  HPI  Admit date: 06/25/2012  Discharge date: 06/30/2012  Discharge Diagnoses:  *Acute respiratory failure with hypoxia  Obstructive pneumonia - ? due to mass  HTN (hypertension)  Hypothyroidism  Dementia  Dehydration  Leukocytosis  Collapse of right lung-middle lobe  Tobacco use- remote  07/20/2012 f/u ov/Aamilah Augenstein cc missed appt  07/25/2012 f/u ov/Adler Chartrand 1st pulmonary eval s/p smoking cessation around 1993 since discharge rx 3lpm 24 hours per day and  still in wheelchair from bed unless walking with therapist - appetite poor but swallowing ok.  More weak than sob.  No obvious daytime variabilty or assoc chronic cough or cp or chest tightness, subjective wheeze overt sinus or hb symptoms. No unusual exp hx   Sleeping ok without nocturnal  or early am exacerbation  of respiratory  c/o's or need for noct saba. Also denies any obvious fluctuation of symptoms with weather or environmental changes or other aggravating or alleviating factors except as outlined above  ROS  The following are not active complaints unless bolded sore throat, dysphagia, dental problems, itching, sneezing,  nasal congestion or excess/ purulent secretions, ear ache,   fever, chills, sweats, unintended wt loss, pleuritic or exertional cp, hemoptysis,  orthopnea pnd or leg swelling, presyncope, palpitations, heartburn, abdominal pain, anorexia, nausea, vomiting, diarrhea  or change in bowel or urinary habits, change in stools or urine, dysuria,hematuria,  rash, arthralgias, visual complaints, headache, numbness weakness or ataxia or problems with walking or coordination,  change in mood/affect or memory.             Objective:   Physical Exam  Very frail w/c bound wf alert knows day of week but not date, thinks her grand-daughter Babette Relic is visiting but actually she lives with her  Wt Readings from Last 3 Encounters:  07/25/12 114 lb  (51.71 kg)  06/25/12 109 lb 2 oz (49.5 kg)    HEENT: nl dentition, turbinates, and orophanx. Nl external ear canals without cough reflex   NECK :  without JVD/Nodes/TM/ nl carotid upstrokes bilaterally   LUNGS: no acc muscle use,  Min decreased bs R base,  without cough on insp or exp maneuvers, no localized wheeze   CV:  RRR  no s3 or murmur or increase in P2, no edema   ABD:  soft and nontender with nl excursion. No bruits or organomegaly, bowel sounds nl  MS:  warm without deformities, calf tenderness, cyanosis or clubbing  SKIN: warm and dry without lesions    NEURO:  alert, approp, no deficits   CXR  07/25/2012 :  Patchy infiltrative density and atelectasis in the right lung base. There is also a small nodular opacity in the lower right lung. Subsegmental atelectasis in right costophrenic angle region. Density in right costophrenic angle region may reflect pleural thickening or small amount of pleural fluid. Stable chronic abnormalities are detailed above.      Assessment:         Plan:

## 2012-07-25 NOTE — Patient Instructions (Addendum)
Finish all the antibiotics you have - ok to wean off 02 if saturations are over 90 but continue to wear it at night automatically for now  Please remember to go to x-ray department downstairs for your tests - we will call you with the results when they are available.  Please schedule a follow up office visit in 4 weeks, sooner if needed with cxr on return

## 2012-07-26 NOTE — Progress Notes (Signed)
Quick Note:  Spoke with pt and notified of results per Dr. Wert. Pt verbalized understanding and denied any questions.  ______ 

## 2012-08-24 ENCOUNTER — Ambulatory Visit (INDEPENDENT_AMBULATORY_CARE_PROVIDER_SITE_OTHER): Payer: Medicare Other | Admitting: Internal Medicine

## 2012-08-24 ENCOUNTER — Ambulatory Visit (INDEPENDENT_AMBULATORY_CARE_PROVIDER_SITE_OTHER)
Admission: RE | Admit: 2012-08-24 | Discharge: 2012-08-24 | Disposition: A | Discharge status: Home / Self Care | Payer: Medicare Other | Source: Ambulatory Visit | Attending: Internal Medicine | Admitting: Internal Medicine

## 2012-08-24 ENCOUNTER — Encounter: Payer: Self-pay | Admitting: Internal Medicine

## 2012-08-24 VITALS — BP 140/78 | HR 57 | Temp 97.6°F | Ht 64.0 in | Wt 113.0 lb

## 2012-08-24 DIAGNOSIS — J9690 Respiratory failure, unspecified, unspecified whether with hypoxia or hypercapnia: Secondary | ICD-10-CM

## 2012-08-24 DIAGNOSIS — J9600 Acute respiratory failure, unspecified whether with hypoxia or hypercapnia: Secondary | ICD-10-CM

## 2012-08-24 DIAGNOSIS — J189 Pneumonia, unspecified organism: Secondary | ICD-10-CM

## 2012-08-24 DIAGNOSIS — J9601 Acute respiratory failure with hypoxia: Secondary | ICD-10-CM

## 2012-08-24 NOTE — Progress Notes (Signed)
Subjective:     Patient ID: [name] Davidson, female   DOB: [birth year]   MRN: [id number]  HPI  Admit date: 06/25/2012  Discharge date: 06/30/2012  Discharge Diagnoses:  *Acute respiratory failure with hypoxia  Obstructive pneumonia - ? due to mass  HTN (hypertension)  Hypothyroidism  Dementia  Dehydration  Leukocytosis  Collapse of right lung-middle lobe  Tobacco use- remote  07/20/2012 f/u ov/[name] Davidson cc missed appt  07/25/2012 f/u ov/[name] Davidson 1st pulmonary eval s/p smoking cessation around 1993 since discharge rx 3lpm 24 hours per day and  still in wheelchair from bed unless walking with therapist - appetite poor but swallowing ok.  More weak than sob.  rec Doreatha Martin all the antibiotics you have - ok to wean off 02 if saturations are over 90 but continue to wear it at night automatically for now  08/24/2012 final f/u ov weaned completely off 02 with no sob, no cough or obvious aspiration with food.  No obvious daytime variabilty  In symptoms nor chest tightness, subjective wheeze overt sinus or hb symptoms. No unusual exp hx   Sleeping ok without nocturnal  or early am exacerbation  of respiratory  c/o's or need for noct saba. Also denies any obvious fluctuation of symptoms with weather or environmental changes or other aggravating or alleviating factors except as outlined above  ROS  The following are not active complaints unless bolded sore throat, dysphagia, dental problems, itching, sneezing,  nasal congestion or excess/ purulent secretions, ear ache,   fever, chills, sweats, unintended wt loss, pleuritic or exertional cp, hemoptysis,  orthopnea pnd or leg swelling, presyncope, palpitations, heartburn, abdominal pain, anorexia, nausea, vomiting, diarrhea  or change in bowel or urinary habits, change in stools or urine, dysuria,hematuria,  rash, arthralgias, visual complaints, headache, numbness weakness or ataxia or problems with walking or coordination,  change in mood/affect or memory.              Objective:   Physical Exam  Very frail w/c bound wf alert but very confused, does not know day of week or that her granddaughter [name] Davidson lives with her  Wt Readings from Last 3 Encounters:  07/25/12 114 lb (51.71 kg)  06/25/12 109 lb 2 oz (49.5 kg)    HEENT: nl dentition, turbinates, and orophanx. Nl external ear canals without cough reflex   NECK :  without JVD/Nodes/TM/ nl carotid upstrokes bilaterally   LUNGS: no acc muscle use,  Min decreased bs R base,  without cough on insp or exp maneuvers, no localized wheeze   CV:  RRR  no s3 or murmur or increase in P2, no edema   ABD:  soft and nontender with nl excursion. No bruits or organomegaly, bowel sounds nl  MS:  warm without deformities, calf tenderness, cyanosis or clubbing  SKIN: warm and dry without lesions       CXR  08/24/2012 :  Continued improvement in aeration at the right lung base with  persistent right infrahilar patchy opacity. Some of this may  represent pulmonary parenchymal scarring in addition to chronic  bronchitic changes.        Assessment:         Plan:

## 2012-08-24 NOTE — Assessment & Plan Note (Signed)
Resolved by self monitoring sats > d/c 02

## 2012-08-24 NOTE — Assessment & Plan Note (Signed)
cxr's reviewed and are strikingly improved with no evidence of a lung mass  I had an extended summary  discussion with the patient and daughter today lasting 15 to 20 minutes of a 25 minute visit on the following issues:   Recurrent pna, esp in RLL can be part of the natural hx of cognitive decline due to intermittent asp and poor cough reflex/ mechanics but no evidence of a definite structural  Lung problem (hav not totally excluded neoplasm but unlikely she has it and even more unlikely she would be a candidate for any form or "early" intervention for asympt dz so do not recommend any further serial cxr's or ct's at this point  ? Of whether will need f/u swallowing studies/ feeding tubes etc depends on the degree fm wants to be aggressive with her overall care so pulmonary f/u is prn

## 2012-08-24 NOTE — Patient Instructions (Addendum)
Discontinue 02 effective today  Be very careful with swallowing to prevent aspiration   Pulmonary follow up can be as needed

## 2012-09-21 ENCOUNTER — Telehealth: Payer: Self-pay | Admitting: Internal Medicine

## 2012-09-21 MED ORDER — LEVOFLOXACIN 500 MG PO TABS: 500.0000 mg | ORAL_TABLET | Freq: Every day | ORAL | Status: DC

## 2012-09-21 NOTE — Telephone Encounter (Signed)
Per CY-okay to give patient Levaquin 500 mg #7 take 1 po qd no refills.

## 2012-09-21 NOTE — Telephone Encounter (Signed)
Called, spoke with pt's daughter, [name] Davidson.  Informed her of below recs per Dr. Maple Hudson.  She verbalized understanding and is aware rx sent to [facility] [address].  Advised to call back if symptoms do not improve or worsen and seek emergency care if needed.  She verbalized understanding and voiced no further questions or concerns at this time.

## 2012-09-21 NOTE — Telephone Encounter (Signed)
Called, spoke with pt's daughter, [name] Davidson.  States pt has a "lung infection" again.  States pt has chest congestion, a rattling in her chest when trying to cough, coughing - occas prod with clear mucus.  Symptoms started the weekend after Christmas.  Denies incrased SOB (reports o2 sat is 92% on RA), chest tightness, chest pain, or f/c/s.  Offered OV today but daughter declined -- would like to know if something can be called in as it is difficult to get pt out and weather is cold.  States she is using Robitusion DM with relief from cough.  States this is "exactly the same thing she had before" and would like same abx but was unsure of what it was.  Looks like pt has been given Levaquin in the past but not by our office.  As Dr. Sherene Sires is off, will route to doc of the day.  Dr. Maple Hudson, pls advise.  Thank you.   [facility] [address]  Last OV with MW - 08/24/12  Allergies verified with [name] Davidson:  Allergies  Allergen Reactions  . Sulfa Drugs Cross Reactors Other (See Comments)    unknown  . Contrast Media (Iodinated Diagnostic Agents) Rash

## 2013-10-04 ENCOUNTER — Inpatient Hospital Stay (HOSPITAL_COMMUNITY)
Admission: EM | Admit: 2013-10-04 | Discharge: 2013-10-10 | DRG: 871 | Disposition: A | Discharge status: Home Health | Payer: Medicare HMO | Attending: Internal Medicine | Admitting: Internal Medicine

## 2013-10-04 ENCOUNTER — Emergency Department (HOSPITAL_COMMUNITY): Payer: Medicare HMO

## 2013-10-04 ENCOUNTER — Encounter (HOSPITAL_COMMUNITY): Payer: Self-pay | Admitting: Emergency Medicine

## 2013-10-04 DIAGNOSIS — Z7401 Bed confinement status: Secondary | ICD-10-CM

## 2013-10-04 DIAGNOSIS — Z87891 Personal history of nicotine dependence: Secondary | ICD-10-CM

## 2013-10-04 DIAGNOSIS — Z681 Body mass index (BMI) 19 or less, adult: Secondary | ICD-10-CM

## 2013-10-04 DIAGNOSIS — E876 Hypokalemia: Secondary | ICD-10-CM | POA: Diagnosis not present

## 2013-10-04 DIAGNOSIS — F0390 Unspecified dementia without behavioral disturbance: Secondary | ICD-10-CM | POA: Diagnosis present

## 2013-10-04 DIAGNOSIS — N179 Acute kidney failure, unspecified: Secondary | ICD-10-CM | POA: Diagnosis present

## 2013-10-04 DIAGNOSIS — L89309 Pressure ulcer of unspecified buttock, unspecified stage: Secondary | ICD-10-CM | POA: Diagnosis present

## 2013-10-04 DIAGNOSIS — Z882 Allergy status to sulfonamides status: Secondary | ICD-10-CM

## 2013-10-04 DIAGNOSIS — J4489 Other specified chronic obstructive pulmonary disease: Secondary | ICD-10-CM | POA: Diagnosis present

## 2013-10-04 DIAGNOSIS — E43 Unspecified severe protein-calorie malnutrition: Secondary | ICD-10-CM | POA: Diagnosis present

## 2013-10-04 DIAGNOSIS — J449 Chronic obstructive pulmonary disease, unspecified: Secondary | ICD-10-CM | POA: Diagnosis present

## 2013-10-04 DIAGNOSIS — M1990 Unspecified osteoarthritis, unspecified site: Secondary | ICD-10-CM | POA: Diagnosis present

## 2013-10-04 DIAGNOSIS — L8995 Pressure ulcer of unspecified site, unstageable: Secondary | ICD-10-CM | POA: Diagnosis present

## 2013-10-04 DIAGNOSIS — J690 Pneumonitis due to inhalation of food and vomit: Secondary | ICD-10-CM | POA: Diagnosis present

## 2013-10-04 DIAGNOSIS — G9340 Encephalopathy, unspecified: Secondary | ICD-10-CM | POA: Diagnosis present

## 2013-10-04 DIAGNOSIS — J189 Pneumonia, unspecified organism: Secondary | ICD-10-CM | POA: Diagnosis present

## 2013-10-04 DIAGNOSIS — I10 Essential (primary) hypertension: Secondary | ICD-10-CM | POA: Diagnosis present

## 2013-10-04 DIAGNOSIS — R6520 Severe sepsis without septic shock: Secondary | ICD-10-CM

## 2013-10-04 DIAGNOSIS — Z66 Do not resuscitate: Secondary | ICD-10-CM | POA: Diagnosis present

## 2013-10-04 DIAGNOSIS — Z8249 Family history of ischemic heart disease and other diseases of the circulatory system: Secondary | ICD-10-CM

## 2013-10-04 DIAGNOSIS — E039 Hypothyroidism, unspecified: Secondary | ICD-10-CM | POA: Diagnosis present

## 2013-10-04 DIAGNOSIS — Z91041 Radiographic dye allergy status: Secondary | ICD-10-CM

## 2013-10-04 DIAGNOSIS — R6510 Systemic inflammatory response syndrome (SIRS) of non-infectious origin without acute organ dysfunction: Secondary | ICD-10-CM | POA: Diagnosis present

## 2013-10-04 DIAGNOSIS — R404 Transient alteration of awareness: Secondary | ICD-10-CM

## 2013-10-04 DIAGNOSIS — R627 Adult failure to thrive: Secondary | ICD-10-CM | POA: Diagnosis present

## 2013-10-04 DIAGNOSIS — E870 Hyperosmolality and hypernatremia: Secondary | ICD-10-CM | POA: Diagnosis present

## 2013-10-04 DIAGNOSIS — R1312 Dysphagia, oropharyngeal phase: Secondary | ICD-10-CM | POA: Diagnosis present

## 2013-10-04 DIAGNOSIS — A419 Sepsis, unspecified organism: Principal | ICD-10-CM | POA: Diagnosis present

## 2013-10-04 DIAGNOSIS — Z79899 Other long term (current) drug therapy: Secondary | ICD-10-CM

## 2013-10-04 DIAGNOSIS — E860 Dehydration: Secondary | ICD-10-CM | POA: Diagnosis present

## 2013-10-04 DIAGNOSIS — R5383 Other fatigue: Secondary | ICD-10-CM | POA: Diagnosis present

## 2013-10-04 LAB — CBC WITH DIFFERENTIAL/PLATELET
Basophils Absolute: 0 10*3/uL (ref 0.0–0.1)
Basophils Relative: 0 % (ref 0–1)
EOS ABS: 0 10*3/uL (ref 0.0–0.7)
Eosinophils Relative: 0 % (ref 0–5)
HCT: 50 % — ABNORMAL HIGH (ref 36.0–46.0)
HEMOGLOBIN: 16.6 g/dL — AB (ref 12.0–15.0)
LYMPHS ABS: 1.2 10*3/uL (ref 0.7–4.0)
Lymphocytes Relative: 8 % — ABNORMAL LOW (ref 12–46)
MCH: 33.5 pg (ref 26.0–34.0)
MCHC: 33.2 g/dL (ref 30.0–36.0)
MCV: 101 fL — ABNORMAL HIGH (ref 78.0–100.0)
MONOS PCT: 8 % (ref 3–12)
Monocytes Absolute: 1.2 10*3/uL — ABNORMAL HIGH (ref 0.1–1.0)
NEUTROS ABS: 13.4 10*3/uL — AB (ref 1.7–7.7)
NEUTROS PCT: 85 % — AB (ref 43–77)
PLATELETS: 212 10*3/uL (ref 150–400)
RBC: 4.95 MIL/uL (ref 3.87–5.11)
RDW: 14.4 % (ref 11.5–15.5)
WBC: 15.7 10*3/uL — AB (ref 4.0–10.5)

## 2013-10-04 LAB — COMPREHENSIVE METABOLIC PANEL
ALBUMIN: 3.4 g/dL — AB (ref 3.5–5.2)
ALK PHOS: 43 U/L (ref 39–117)
ALT: 24 U/L (ref 0–35)
AST: 57 U/L — ABNORMAL HIGH (ref 0–37)
BUN: 56 mg/dL — AB (ref 6–23)
CHLORIDE: 119 meq/L — AB (ref 96–112)
CO2: 22 mEq/L (ref 19–32)
Calcium: 9.3 mg/dL (ref 8.4–10.5)
Creatinine, Ser: 1.36 mg/dL — ABNORMAL HIGH (ref 0.50–1.10)
GFR calc non Af Amer: 33 mL/min — ABNORMAL LOW (ref >90)
GFR, EST AFRICAN AMERICAN: 39 mL/min — AB (ref >90)
GLUCOSE: 145 mg/dL — AB (ref 70–99)
Potassium: 4 mEq/L (ref 3.7–5.3)
SODIUM: 160 meq/L — AB (ref 137–147)
TOTAL PROTEIN: 7 g/dL (ref 6.0–8.3)
Total Bilirubin: 0.5 mg/dL (ref 0.3–1.2)

## 2013-10-04 LAB — APTT: APTT: 24 s (ref 24–37)

## 2013-10-04 LAB — GLUCOSE, CAPILLARY: Glucose-Capillary: 148 mg/dL — ABNORMAL HIGH (ref 70–99)

## 2013-10-04 LAB — URINALYSIS, ROUTINE W REFLEX MICROSCOPIC
GLUCOSE, UA: NEGATIVE mg/dL
Hgb urine dipstick: NEGATIVE
Ketones, ur: 15 mg/dL — AB
Leukocytes, UA: NEGATIVE
Nitrite: NEGATIVE
PH: 5 (ref 5.0–8.0)
PROTEIN: NEGATIVE mg/dL
Specific Gravity, Urine: 1.024 (ref 1.005–1.030)
Urobilinogen, UA: 0.2 mg/dL (ref 0.0–1.0)

## 2013-10-04 LAB — TROPONIN I

## 2013-10-04 LAB — MRSA PCR SCREENING: MRSA by PCR: NEGATIVE

## 2013-10-04 LAB — CG4 I-STAT (LACTIC ACID): LACTIC ACID, VENOUS: 2.27 mmol/L — AB (ref 0.5–2.2)

## 2013-10-04 MED ORDER — VANCOMYCIN HCL IN DEXTROSE 1-5 GM/200ML-% IV SOLN
1000.0000 mg | INTRAVENOUS | Status: DC
  Administered 2013-10-06: 1000 mg via INTRAVENOUS
  Filled 2013-10-04 (×2): qty 200

## 2013-10-04 MED ORDER — ACETAMINOPHEN 650 MG RE SUPP
650.0000 mg | Freq: Once | RECTAL | Status: AC
  Administered 2013-10-04: 650 mg via RECTAL
  Filled 2013-10-04: qty 1

## 2013-10-04 MED ORDER — IPRATROPIUM BROMIDE 0.02 % IN SOLN
0.5000 mg | Freq: Four times a day (QID) | RESPIRATORY_TRACT | Status: DC
  Administered 2013-10-04: 0.5 mg via RESPIRATORY_TRACT
  Filled 2013-10-04: qty 2.5

## 2013-10-04 MED ORDER — HALOPERIDOL LACTATE 5 MG/ML IJ SOLN: 0.5000 mg | Freq: Four times a day (QID) | INTRAMUSCULAR | Status: DC | PRN

## 2013-10-04 MED ORDER — HEPARIN SODIUM (PORCINE) 5000 UNIT/ML IJ SOLN
5000.0000 [IU] | Freq: Three times a day (TID) | INTRAMUSCULAR | Status: DC
  Administered 2013-10-04 – 2013-10-10 (×15): 5000 [IU] via SUBCUTANEOUS
  Filled 2013-10-04 (×21): qty 1

## 2013-10-04 MED ORDER — IPRATROPIUM BROMIDE 0.02 % IN SOLN
0.5000 mg | Freq: Three times a day (TID) | RESPIRATORY_TRACT | Status: DC
  Administered 2013-10-05 (×2): 0.5 mg via RESPIRATORY_TRACT
  Filled 2013-10-04 (×2): qty 2.5

## 2013-10-04 MED ORDER — OSELTAMIVIR PHOSPHATE 75 MG PO CAPS: 75.0000 mg | ORAL_CAPSULE | Freq: Two times a day (BID) | ORAL | Status: DC

## 2013-10-04 MED ORDER — VANCOMYCIN HCL IN DEXTROSE 1-5 GM/200ML-% IV SOLN
1000.0000 mg | Freq: Once | INTRAVENOUS | Status: AC
  Administered 2013-10-04: 1000 mg via INTRAVENOUS
  Filled 2013-10-04: qty 200

## 2013-10-04 MED ORDER — SODIUM CHLORIDE 0.9 % IV SOLN
1000.0000 mL | Freq: Once | INTRAVENOUS | Status: AC
  Administered 2013-10-04: 1000 mL via INTRAVENOUS

## 2013-10-04 MED ORDER — ACETAMINOPHEN 650 MG RE SUPP: 650.0000 mg | Freq: Four times a day (QID) | RECTAL | Status: DC | PRN

## 2013-10-04 MED ORDER — MORPHINE SULFATE 2 MG/ML IJ SOLN: 2.0000 mg | INTRAMUSCULAR | Status: DC | PRN

## 2013-10-04 MED ORDER — OSELTAMIVIR PHOSPHATE 30 MG PO CAPS
30.0000 mg | ORAL_CAPSULE | Freq: Two times a day (BID) | ORAL | Status: DC
  Administered 2013-10-05: 30 mg via ORAL
  Filled 2013-10-04 (×3): qty 1

## 2013-10-04 MED ORDER — SODIUM CHLORIDE 0.9 % IV SOLN: 1000.0000 mL | INTRAVENOUS | Status: DC

## 2013-10-04 MED ORDER — GUAIFENESIN-DM 100-10 MG/5ML PO SYRP
5.0000 mL | ORAL_SOLUTION | ORAL | Status: DC | PRN
  Filled 2013-10-04: qty 5

## 2013-10-04 MED ORDER — ASPIRIN 300 MG RE SUPP
300.0000 mg | Freq: Every day | RECTAL | Status: DC
Start: 2013-10-04 — End: 2013-10-05
  Administered 2013-10-04 – 2013-10-05 (×2): 300 mg via RECTAL
  Filled 2013-10-04 (×2): qty 1

## 2013-10-04 MED ORDER — LEVOTHYROXINE SODIUM 100 MCG IV SOLR
25.0000 ug | Freq: Every day | INTRAVENOUS | Status: DC
  Administered 2013-10-05: 25 ug via INTRAVENOUS
  Filled 2013-10-04 (×2): qty 5

## 2013-10-04 MED ORDER — METOPROLOL TARTRATE 1 MG/ML IV SOLN
2.5000 mg | Freq: Four times a day (QID) | INTRAVENOUS | Status: DC
  Administered 2013-10-04 – 2013-10-05 (×3): 2.5 mg via INTRAVENOUS
  Filled 2013-10-04 (×7): qty 5

## 2013-10-04 MED ORDER — GUAIFENESIN ER 600 MG PO TB12
600.0000 mg | ORAL_TABLET | Freq: Two times a day (BID) | ORAL | Status: DC
  Administered 2013-10-05 (×2): 600 mg via ORAL
  Filled 2013-10-04 (×6): qty 1

## 2013-10-04 MED ORDER — ACETAMINOPHEN 325 MG PO TABS
650.0000 mg | ORAL_TABLET | Freq: Four times a day (QID) | ORAL | Status: DC | PRN
  Administered 2013-10-06 – 2013-10-07 (×2): 650 mg via ORAL
  Filled 2013-10-04 (×2): qty 2

## 2013-10-04 MED ORDER — DEXTROSE 5 % IV SOLN
2.0000 g | Freq: Once | INTRAVENOUS | Status: AC
  Administered 2013-10-04: 2 g via INTRAVENOUS
  Filled 2013-10-04: qty 2

## 2013-10-04 MED ORDER — PIPERACILLIN-TAZOBACTAM 3.375 G IVPB
3.3750 g | Freq: Three times a day (TID) | INTRAVENOUS | Status: DC
  Administered 2013-10-04 – 2013-10-08 (×12): 3.375 g via INTRAVENOUS
  Filled 2013-10-04 (×15): qty 50

## 2013-10-04 MED ORDER — ALBUTEROL SULFATE (2.5 MG/3ML) 0.083% IN NEBU
2.5000 mg | INHALATION_SOLUTION | Freq: Three times a day (TID) | RESPIRATORY_TRACT | Status: DC
  Administered 2013-10-05 (×2): 2.5 mg via RESPIRATORY_TRACT
  Filled 2013-10-04 (×2): qty 3

## 2013-10-04 MED ORDER — ONDANSETRON HCL 4 MG PO TABS: 4.0000 mg | ORAL_TABLET | Freq: Four times a day (QID) | ORAL | Status: DC | PRN

## 2013-10-04 MED ORDER — ONDANSETRON HCL 4 MG/2ML IJ SOLN: 4.0000 mg | Freq: Four times a day (QID) | INTRAMUSCULAR | Status: DC | PRN

## 2013-10-04 MED ORDER — OSELTAMIVIR PHOSPHATE 30 MG PO CAPS
30.0000 mg | ORAL_CAPSULE | Freq: Once | ORAL | Status: DC
  Filled 2013-10-04: qty 1

## 2013-10-04 MED ORDER — ALBUTEROL SULFATE (2.5 MG/3ML) 0.083% IN NEBU
2.5000 mg | INHALATION_SOLUTION | Freq: Four times a day (QID) | RESPIRATORY_TRACT | Status: DC
Start: 2013-10-04 — End: 2013-10-04
  Administered 2013-10-04: 2.5 mg via RESPIRATORY_TRACT
  Filled 2013-10-04: qty 3

## 2013-10-04 MED ORDER — SODIUM CHLORIDE 0.45 % IV SOLN
INTRAVENOUS | Status: DC
  Administered 2013-10-04: 125 mL/h via INTRAVENOUS
  Administered 2013-10-05: 1000 mL via INTRAVENOUS
  Administered 2013-10-05: 01:00:00 via INTRAVENOUS

## 2013-10-04 MED ORDER — ALUM & MAG HYDROXIDE-SIMETH 200-200-20 MG/5ML PO SUSP: 30.0000 mL | Freq: Four times a day (QID) | ORAL | Status: DC | PRN

## 2013-10-04 MED ORDER — SODIUM CHLORIDE 0.9 % IJ SOLN
3.0000 mL | Freq: Two times a day (BID) | INTRAMUSCULAR | Status: DC
  Administered 2013-10-04 – 2013-10-08 (×5): 3 mL via INTRAVENOUS

## 2013-10-04 NOTE — Progress Notes (Signed)
ANTIBIOTIC CONSULT NOTE - INITIAL  Pharmacy Consult for zosyn and vancomycin Indication: pneumonia  Allergies  Allergen Reactions  . Sulfa Drugs Cross Reactors Other (See Comments)    unknown  . Contrast Media [Iodinated Diagnostic Agents] Rash    Patient Measurements:   Adjusted Body Weight:   Vital Signs: Temp: 101.2 F (38.4 C) (01/15 1216) Temp src: Rectal (01/15 1216) BP: 132/70 mmHg (01/15 1500) Pulse Rate: 85 (01/15 1500) Intake/Output from previous day:   Intake/Output from this shift:    Labs:  Recent Labs  10/04/13 1255  WBC 15.7*  HGB 16.6*  PLT 212  CREATININE 1.36*   The CrCl is unknown because both a height and weight (above a minimum accepted value) are required for this calculation. No results found for this basename: VANCOTROUGH, VANCOPEAK, VANCORANDOM, GENTTROUGH, GENTPEAK, GENTRANDOM, TOBRATROUGH, TOBRAPEAK, TOBRARND, AMIKACINPEAK, AMIKACINTROU, AMIKACIN,  in the last 72 hours   Microbiology: No results found for this or any previous visit (from the past 720 hour(s)).  Medical History: Past Medical History  Diagnosis Date  . Dementia   . Osteoarthritis   . Hypertension   . Thyroid disease   . Renal disorder     Medications:  Scheduled:  . albuterol  2.5 mg Nebulization Q6H  . aspirin  300 mg Rectal Daily  . guaiFENesin  600 mg Oral BID  . heparin  5,000 Units Subcutaneous Q8H  . ipratropium  0.5 mg Nebulization Q6H  . levothyroxine  25 mcg Intravenous Daily  . metoprolol  2.5 mg Intravenous Q6H  . oseltamivir  30 mg Oral Once  . oseltamivir  75 mg Oral BID  . sodium chloride  3 mL Intravenous Q12H   Infusions:  . sodium chloride    . sodium chloride     Assessment: 80 yo female with pneumonia will be started on vancomycin and zosyn.  Patient received 1g of vancomycin on 10/04/13 at 1438.  Patient is in acute renal failure; her SCr is up to 1.36 from 0.65 (CrCl ~23). Patient's weight is 53 kg  Goal of Therapy:  Vancomycin  trough level 15-20 mcg/ml  Plan:  1) Zosyn 3.375g iv q8h (4h infusion) 2) Vancomycin 1g iv q48h, next dose at 1430 on 10/06/13 3) Monitor renal function to make sure the dosages of the antibiotics do not need to be adjusted.  Vincie Linn, Tsz-Yin 10/04/2013,4:45 PM

## 2013-10-04 NOTE — ED Notes (Signed)
Attempted to call report to 3S RN, was told will call back in 5 mins.

## 2013-10-04 NOTE — ED Notes (Signed)
  Lactic acid results called to Morrie SheldonAshley, [place]

## 2013-10-04 NOTE — H&P (Addendum)
[facility]                                                                                        History and Physical   Patient Demographics  [name] Rockslizabeth Davidson, is a 80 y.o. female  MRN: [id number]   DOB - 1922/05/25  Admit Date - 10/04/2013  Outpatient Primary MD for the patient is Pamelia HoitWILSON,FRED HENRY, MD   With History of -  Past Medical History  Diagnosis Date  . Dementia   . Osteoarthritis   . Hypertension   . Thyroid disease   . Renal disorder       Past Surgical History  Procedure Laterality Date  . Cyst removal neck    . Total abdominal hysterectomy    . Cholecystectomy      in for   Chief Complaint  Patient presents with  . Altered Mental Status     HPI  [name] Rockslizabeth Davidson  is a 80 y.o. female, with a past medical history of dementia hypertension, thyroid disease, right middle lung lobe collapse, sacral decubitus ulcer, and dehydration who presents to the emergency department with lethargy and altered mental status.  There is no family in the room and the patient is unable to provide her own history. Consequently history was gathered from the chart and the emergency department physician. The patient lives at home with her daughter. She is primarily bedridden but able to take some steps with assistance. The daughter stated the patient was lethargic for the last 2 days. In the emergency department she is found to be febrile with a temp of 101, tachycardic, and tachypnea. Her sodium level is 160. She appears severely dehydrated.  Review of Systems    Unable to obtain as the patient is unable to communicate.   A full 10 point Review of Systems was done, except as stated above, all other Review of Systems were negative.   Social History History  Substance Use Topics  . Smoking status: Former Smoker -- 1.00 packs/day for 10 years    Types: Cigarettes    Quit date: 09/21/1991  . Smokeless tobacco: Never Used  . Alcohol Use: No     Family History Family  History  Problem Relation Age of Onset  . Heart disease Mother      Prior to Admission medications   Medication Sig Start Date End Date Taking? Authorizing Provider  amLODipine (NORVASC) 5 MG tablet Take 5 mg by mouth daily.   Yes Historical Provider, MD  gabapentin (NEURONTIN) 300 MG capsule Take 300 mg by mouth at bedtime.   Yes Historical Provider, MD  HYDROcodone-acetaminophen (NORCO) 10-325 MG per tablet Take 1-2 tablets by mouth every 6 (six) hours as needed. For pain   Yes Historical Provider, MD  levothyroxine (SYNTHROID, LEVOTHROID) 50 MCG tablet Take 50 mcg by mouth at bedtime.   Yes Historical Provider, MD  memantine (NAMENDA) 10 MG tablet Take 10 mg by mouth 2 (two) times daily.   Yes Historical Provider, MD  methocarbamol (ROBAXIN) 500 MG tablet Take 500 mg by mouth every morning.   Yes Historical Provider, MD  metoprolol succinate (TOPROL-XL) 25 MG 24 hr  tablet Take 25 mg by mouth daily.   Yes Historical Provider, MD  mirtazapine (REMERON) 30 MG tablet Take 30 mg by mouth at bedtime.   Yes Historical Provider, MD  OVER THE COUNTER MEDICATION Take 1 tablet by mouth daily. Protandim Supplement   Yes Historical Provider, MD  risperiDONE (RISPERDAL) 0.5 MG tablet Take 0.5 mg by mouth 2 (two) times daily. 09/26/13  Yes Historical Provider, MD    Allergies  Allergen Reactions  . Sulfa Drugs Cross Reactors Other (See Comments)    unknown  . Contrast Media [Iodinated Diagnostic Agents] Rash    Physical Exam  Vitals  Blood pressure 132/70, pulse 85, temperature 101.2 F (38.4 C), temperature source Rectal, resp. rate 21, SpO2 96.00%.   General: Appears emaciated and chronically ill, unable to follow commands, barely able to move, lying in the emergency department  Psych:  Unable to assess as the patient is only minimally verbally responsive  Neuro:   Weak but able to move all 4 extremities, attempts to follow commands. Unable to speak intelligibly  ENT:  Patient unable to  open her eyes, or mouth.  Mucous membranes are dry  Neck:  Supple Neck, No JVD, No cervical lymphadenopathy appriciated  Respiratory:  Patient does not inspire deeply at my request. She has some rattling cough, at this time she has not had increased work of breathing.  Cardiac:  RRR, No Gallops, Rubs or Murmurs, No Parasternal Heave.  Abdomen:  Thin, soft, nontender, no masses, well-healed scar, dirt in skin creases, positive bowel sounds  Skin:  Tenting of the skin, no rashes or lesions noted, sacral area not examined     Data Review  CBC  Recent Labs Lab 10/04/13 1255  WBC 15.7*  HGB 16.6*  HCT 50.0*  PLT 212  MCV 101.0*  MCH 33.5  MCHC 33.2  RDW 14.4  LYMPHSABS 1.2  MONOABS 1.2*  EOSABS 0.0  BASOSABS 0.0   ------------------------------------------------------------------------------------------------------------------  Chemistries   Recent Labs Lab 10/04/13 1255  NA 160*  K 4.0  CL 119*  CO2 22  GLUCOSE 145*  BUN 56*  CREATININE 1.36*  CALCIUM 9.3  AST 57*  ALT 24  ALKPHOS 43  BILITOT 0.5     ---------------------------------------------------------------------------------------------------------------  Urinalysis    Component Value Date/Time   COLORURINE AMBER* 10/04/2013 1328   APPEARANCEUR CLEAR 10/04/2013 1328   LABSPEC 1.024 10/04/2013 1328   PHURINE 5.0 10/04/2013 1328   GLUCOSEU NEGATIVE 10/04/2013 1328   HGBUR NEGATIVE 10/04/2013 1328   BILIRUBINUR MODERATE* 10/04/2013 1328   KETONESUR 15* 10/04/2013 1328   PROTEINUR NEGATIVE 10/04/2013 1328   UROBILINOGEN 0.2 10/04/2013 1328   NITRITE NEGATIVE 10/04/2013 1328   LEUKOCYTESUR NEGATIVE 10/04/2013 1328    ----------------------------------------------------------------------------------------------------------------  Imaging results:   Dg Chest Port 1 View  10/04/2013   CLINICAL DATA:  Altered mental status  EXAM: PORTABLE CHEST - 1 VIEW  COMPARISON:  Portable exam 1252 hr compared to  08/24/2012  FINDINGS: Rotation to the right.  Upper normal heart size.  Atherosclerotic calcification aorta.  Mediastinal contours and pulmonary vascularity otherwise normal.  Emphysematous and bronchitic changes with minimal right basilar atelectasis.  No acute infiltrate, pleural effusion, or pneumothorax.  Diffuse osseous demineralization with prior spinal augmentation procedure at lower thoracic spine.  Calcified granuloma right apex.  Skin folds project over left chest.  IMPRESSION: COPD changes with minimal right basilar atelectasis.   Electronically Signed   By: Ulyses Southward M.D.   On: 10/04/2013 13:05  My personal review of EKG: Low-voltage, sinus tach    Assessment & Plan  Principal Problem:   Sepsis Active Problems:   Dehydration   HTN (hypertension)   Hypothyroidism   Dementia   Pneumonia   Hypernatremia   Lethargy   Sepsis Uncertain etiology: Possibly influenza versus aspiration pneumonia. Blood and urine cultures are pending Patient being admitted step down Start on vancomycin, Zosyn and Tamiflu   Acute encephalopathy  - Secondary to above, dehydration and hypernatremia - Treat with IV fluids, and antimicrobial agents as outlined above  - Follow clinical course, if mental status unchanged, will need further imaging   Community acquired pneumonia with possible influenza Patient has been started on vancomycin, Zosyn, Tamiflu Influenza PCR pending Followup chest x-ray in a.m. after IV hydration  COPD with smoking history, and history of lung collapse Scheduled Nebulizers When necessary oxygen Mucolytics  Hypernatremia with severe dehydration Patient currently unable to take by mouth's Likely has had minimal oral intake for days to weeks Will hold all oral medications. Utilize IV meds Speech therapy to evaluate. Question of aspiration pneumonia. Treat with half normal saline at 125 an hour  Hypothyroidism Continue Synthroid IV Check  TSH  Hypertension Hold oral medications Lopressor IV every 6  Patient on several psychotropics - possibly for dementia +/- agitation Uncertain as patient is very lethargic and minimally responsive at this time Will order Haldol IV PRN  Suspected failure to thrive - Long-standing history of dementia, suspect very poor functional status at baseline. - Poor overall prognosis   DVT Prophylaxis heparin  AM Labs Ordered, also please review Full Orders  Family Communication:   Called and spoke to son-in-law, but need to speak to daughter (primary caretaker) when she is available   Code Status:  Full  Likely DC to  home versus SNF  Condition:  Guarded  Time spent in minutes : 60    York, Marianne L PA-C on 10/04/2013 at 3:48 PM  Between 7am to 7pm - Pager - [phone]  After 7pm go to [email or web address] - password [email or web address]  And look for the night coverage person covering me after hours  [facility] Office  [phone]  Attending - Patient seen and examined, subsequently spoke with patient's daughter Ms. Carlynn Purl (cell [phone]), unfortunately this patient has severe dementia, and has been struggling with very poor appetite for the past 3-4 months. She eats very minimal, family has been giving her boost up to 3 times a day. Over the past 2 days, patient was noted to have worsening lethargy, and has not eaten at all. She was brought to the emergency room, found to have a fever and significant leukocytosis. She was also found to be dehydrated, electrolytes showed elevated BUN and creatinine, and a significantly elevated sodium of 160. Chest x-ray negative for pneumonia, urinalysis not consistent with a UTI. Lactic acid slightly elevated. Suspect severe sepsis secondary to probable aspiration pneumonia or influenza. No other source apparent. We'll admit to step down, hydrate, empirically start on Vanco/Zosyn and Tamiflu. Obtain blood and urine cultures. Spoke with  patient's daughter Ms. [facility], explained overall poor prognosis. Mr. Archie Patten wants to continue with full CODE STATUS, I have advised and recommended a DO NOT RESUSCITATE. Will continue with above measures, if no improvement in the next 1-2 days will require palliative care consultation.  Windell Norfolk MD

## 2013-10-04 NOTE — ED Notes (Signed)
Per EMS, pt coming from home with AMS x36 hours per family. Pt has underlying dementia, family assumed pt just resting. Family noticed pt not responding as much and felt feverish.

## 2013-10-04 NOTE — ED Provider Notes (Addendum)
CSN: [id number]     Arrival date & time 10/04/13  1142 History   First MD Initiated Contact with Patient 10/04/13 1241     Chief Complaint  Patient presents with  . Altered Mental Status   (Consider location/radiation/quality/duration/timing/severity/associated sxs/prior Treatment) HPI Comments: 80 year old white female presents emergency department via EMS with chief complaint of altered mental status. History obtained from the patient's daughter who is her primary caregiver. Per her daughter the patient is prone to urinary tract infections. She reports the patient has been lethargic and altered for 2 days.  She is a past mental history significant for dementia, arthritis, hypertension, thyroid disease, kidney disease.  Patient is a 80 y.o. female presenting with altered mental status. The history is provided by the patient and a relative (daughter who cares for pt). The history is limited by the condition of the patient.  Altered Mental Status Presenting symptoms: behavior changes, confusion, disorientation and lethargy   Presenting symptoms: no combativeness   Severity:  Moderate Most recent episode:  2 days ago Episode history:  Continuous Duration:  2 days Timing:  Constant Progression:  Worsening Chronicity:  Recurrent Context: dementia   Context: not head injury, not homeless, taking medications as prescribed and not a recent change in medication   Associated symptoms: fever   Associated symptoms: no seizures   Associated symptoms comment:  H/o chronic UTIs   Past Medical History  Diagnosis Date  . Dementia   . Osteoarthritis   . Hypertension   . Thyroid disease   . Renal disorder    Past Surgical History  Procedure Laterality Date  . Cyst removal neck    . Total abdominal hysterectomy    . Cholecystectomy     Family History  Problem Relation Age of Onset  . Heart disease Mother    History  Substance Use Topics  . Smoking status: Former Smoker -- 1.00  packs/day for 10 years    Types: Cigarettes    Quit date: 09/21/1991  . Smokeless tobacco: Never Used  . Alcohol Use: No   OB History   Grav Para Term Preterm Abortions TAB SAB Ect Mult Living                 Review of Systems  Unable to perform ROS: Dementia  Constitutional: Positive for fever, activity change and appetite change.  HENT: Negative.   Eyes: Negative.   Respiratory: Negative.   Cardiovascular: Negative.   Genitourinary:       History of chronic UTIs in the past  Neurological: Positive for speech difficulty. Negative for tremors, seizures and syncope.  Psychiatric/Behavioral: Positive for confusion.    Allergies  Sulfa drugs cross reactors and Contrast media  Home Medications   No current outpatient prescriptions on file. BP 100/53  Pulse 85  Temp(Src) 99.1 F (37.3 C) (Axillary)  Resp 21  Ht 5' (1.524 m)  Wt 97 lb (44 kg)  BMI 18.94 kg/m2  SpO2 96% Physical Exam  Vitals reviewed. Constitutional: She appears lethargic. She has a sickly appearance.  HENT:  Head: Normocephalic and atraumatic.  Mouth/Throat: Oropharynx is clear and moist.  Eyes: Conjunctivae are normal. Right eye exhibits no discharge. Left eye exhibits no discharge.  Neck: Normal range of motion. Neck supple.  Cardiovascular: Normal pulses.  Tachycardia present.  Exam reveals no friction rub.   No murmur heard. Pulmonary/Chest: Breath sounds normal. No respiratory distress.  Abdominal: Soft. Bowel sounds are normal. She exhibits no distension and no mass. There is  no tenderness. There is no rebound and no guarding.  Musculoskeletal: Normal range of motion. She exhibits no edema and no tenderness.  Neurological: She appears lethargic. GCS eye subscore is 4. GCS verbal subscore is 1. GCS motor subscore is 5.  Nonverbal, with prodding will follow minimal commands (thumbs up bilaterally).  Pt with significant dementia per daughter.  Difficult to complete thorough neurologic examination.    Skin: Rash noted.       ED Course  Procedures (including critical care time) Labs Review Labs Reviewed  CBC WITH DIFFERENTIAL - Abnormal; Notable for the following:    WBC 15.7 (*)    Hemoglobin 16.6 (*)    HCT 50.0 (*)    MCV 101.0 (*)    Neutrophils Relative % 85 (*)    Neutro Abs 13.4 (*)    Lymphocytes Relative 8 (*)    Monocytes Absolute 1.2 (*)    All other components within normal limits  COMPREHENSIVE METABOLIC PANEL - Abnormal; Notable for the following:    Sodium 160 (*)    Chloride 119 (*)    Glucose, Bld 145 (*)    BUN 56 (*)    Creatinine, Ser 1.36 (*)    Albumin 3.4 (*)    AST 57 (*)    GFR calc non Af Amer 33 (*)    GFR calc Af Amer 39 (*)    All other components within normal limits  URINALYSIS, ROUTINE W REFLEX MICROSCOPIC - Abnormal; Notable for the following:    Color, Urine AMBER (*)    Bilirubin Urine MODERATE (*)    Ketones, ur 15 (*)    All other components within normal limits  CG4 I-STAT (LACTIC ACID) - Abnormal; Notable for the following:    Lactic Acid, Venous 2.27 (*)    All other components within normal limits  CULTURE, BLOOD (ROUTINE X 2)  CULTURE, BLOOD (ROUTINE X 2)  URINE CULTURE  RESPIRATORY VIRUS PANEL  MRSA PCR SCREENING  APTT  TSH  TROPONIN I  INFLUENZA PANEL BY PCR (TYPE A & B, H1N1)  BASIC METABOLIC PANEL  CBC   Imaging Review Dg Chest Port 1 View  10/04/2013   CLINICAL DATA:  Altered mental status  EXAM: PORTABLE CHEST - 1 VIEW  COMPARISON:  Portable exam 1252 hr compared to 08/24/2012  FINDINGS: Rotation to the right.  Upper normal heart size.  Atherosclerotic calcification aorta.  Mediastinal contours and pulmonary vascularity otherwise normal.  Emphysematous and bronchitic changes with minimal right basilar atelectasis.  No acute infiltrate, pleural effusion, or pneumothorax.  Diffuse osseous demineralization with prior spinal augmentation procedure at lower thoracic spine.  Calcified granuloma right apex.  Skin folds  project over left chest.  IMPRESSION: COPD changes with minimal right basilar atelectasis.   Electronically Signed   By: Ulyses Southward M.D.   On: 10/04/2013 13:05    EKG Interpretation    Date/Time:  Thursday October 04 2013 11:55:29 EST Ventricular Rate:  121 PR Interval:  138 QRS Duration: 79 QT Interval:  325 QTC Calculation: 461 R Axis:   -77 Text Interpretation:  Sinus tachycardia with irregular rate Left anterior fascicular block Borderline T abnormalities, lateral leads Confirmed by MASNERI  MD, DAVID ([id number]) on 10/04/2013 12:11:54 PM            MDM   1. Sepsis   2. Altered sensorium   3. Dehydration   4. Hypernatremia   5. Acute encephalopathy      80 year old female presents emergency department  with 2 day history of lethargy. History obtained from the patient's daughter whom she lives with. Her daughter is her primary caregiver. Patient has a past medical history of dementia, osteoarthritis, hypertension, thyroid disease, renal disorder, multiple UTIs in the past. She is bedridden for the most part but is able to ambulate minimally with assistance and walker at baseline.    On arrival patient's vital signs noted to be tachycardic and febrile. Her blood pressure is normal but based on her history and physical a code sepsis was initiated.  CODE STATUS confirmed with daughter patient is a full code.  ER workup included antibiotics Rocephin 2 g initially. Case discussed with internal medicine plan to admit to step down unit. Plan for Tamiflu and screen for influenza. I will also add vancomycin.  Case discussed with Dr.Ghimire will assume management of the patient. Vital signs are stable patient's heart rate has improved and her blood pressure is stable.   CRITICAL CARE Performed by: Redgie Grayer, DAVID   Total critical care time: 30  Critical care time was exclusive of separately billable procedures and treating other patients.  Critical care was necessary to treat or  prevent imminent or life-threatening deterioration.  Critical care was time spent personally by me on the following activities: development of treatment plan with patient and/or surrogate as well as nursing, discussions with consultants, evaluation of patient's response to treatment, examination of patient, obtaining history from patient or surrogate, ordering and performing treatments and interventions, ordering and review of laboratory studies, ordering and review of radiographic studies, pulse oximetry and re-evaluation of patient's condition.            Darlys Gales, MD 10/04/13 Mallie Snooks  Darlys Gales, MD 10/04/13 [phone]

## 2013-10-05 ENCOUNTER — Inpatient Hospital Stay (HOSPITAL_COMMUNITY): Payer: Medicare HMO

## 2013-10-05 DIAGNOSIS — F0390 Unspecified dementia without behavioral disturbance: Secondary | ICD-10-CM

## 2013-10-05 LAB — URINE CULTURE
CULTURE: NO GROWTH
Colony Count: NO GROWTH

## 2013-10-05 LAB — GLUCOSE, CAPILLARY
GLUCOSE-CAPILLARY: 127 mg/dL — AB (ref 70–99)
Glucose-Capillary: 132 mg/dL — ABNORMAL HIGH (ref 70–99)
Glucose-Capillary: 144 mg/dL — ABNORMAL HIGH (ref 70–99)

## 2013-10-05 LAB — BASIC METABOLIC PANEL
BUN: 47 mg/dL — AB (ref 6–23)
BUN: 36 mg/dL — ABNORMAL HIGH (ref 6–23)
CALCIUM: 8 mg/dL — AB (ref 8.4–10.5)
CHLORIDE: 115 meq/L — AB (ref 96–112)
CO2: 22 mEq/L (ref 19–32)
CO2: 21 mEq/L (ref 19–32)
CREATININE: 0.88 mg/dL (ref 0.50–1.10)
Calcium: 7.9 mg/dL — ABNORMAL LOW (ref 8.4–10.5)
Chloride: 121 mEq/L — ABNORMAL HIGH (ref 96–112)
Creatinine, Ser: 0.75 mg/dL (ref 0.50–1.10)
GFR calc Af Amer: 66 mL/min — ABNORMAL LOW (ref >90)
GFR calc non Af Amer: 73 mL/min — ABNORMAL LOW (ref >90)
GFR, EST AFRICAN AMERICAN: 84 mL/min — AB (ref >90)
GFR, EST NON AFRICAN AMERICAN: 57 mL/min — AB (ref >90)
GLUCOSE: 125 mg/dL — AB (ref 70–99)
Glucose, Bld: 150 mg/dL — ABNORMAL HIGH (ref 70–99)
Potassium: 3.5 mEq/L — ABNORMAL LOW (ref 3.7–5.3)
Potassium: 3.3 mEq/L — ABNORMAL LOW (ref 3.7–5.3)
Sodium: 157 mEq/L — ABNORMAL HIGH (ref 137–147)
Sodium: 153 mEq/L — ABNORMAL HIGH (ref 137–147)

## 2013-10-05 LAB — INFLUENZA PANEL BY PCR (TYPE A & B)
H1N1 flu by pcr: NOT DETECTED
INFLAPCR: NEGATIVE
INFLBPCR: NEGATIVE

## 2013-10-05 LAB — CBC
HCT: 42 % (ref 36.0–46.0)
HEMOGLOBIN: 13.6 g/dL (ref 12.0–15.0)
MCH: 32.8 pg (ref 26.0–34.0)
MCHC: 32.4 g/dL (ref 30.0–36.0)
MCV: 101.2 fL — ABNORMAL HIGH (ref 78.0–100.0)
PLATELETS: 158 10*3/uL (ref 150–400)
RBC: 4.15 MIL/uL (ref 3.87–5.11)
RDW: 14.1 % (ref 11.5–15.5)
WBC: 14.9 10*3/uL — ABNORMAL HIGH (ref 4.0–10.5)

## 2013-10-05 LAB — RESPIRATORY VIRUS PANEL
ADENOVIRUS: NOT DETECTED
Influenza A H1: NOT DETECTED
Influenza A H3: NOT DETECTED
Influenza A: NOT DETECTED
Influenza B: NOT DETECTED
METAPNEUMOVIRUS: NOT DETECTED
PARAINFLUENZA 1 A: NOT DETECTED
PARAINFLUENZA 3 A: NOT DETECTED
Parainfluenza 2: NOT DETECTED
RHINOVIRUS: NOT DETECTED
Respiratory Syncytial Virus A: NOT DETECTED
Respiratory Syncytial Virus B: NOT DETECTED

## 2013-10-05 LAB — TSH: TSH: 0.675 u[IU]/mL (ref 0.350–4.500)

## 2013-10-05 IMAGING — CR DG CHEST 2V
2 series · 2 of 2 positions shown · non-contrast
Comparison: Chest x-ray 06/25/2012.

CLINICAL DATA: Follow-up evaluation of pneumonia.

CHEST - 2 VIEW

[x chest ap]
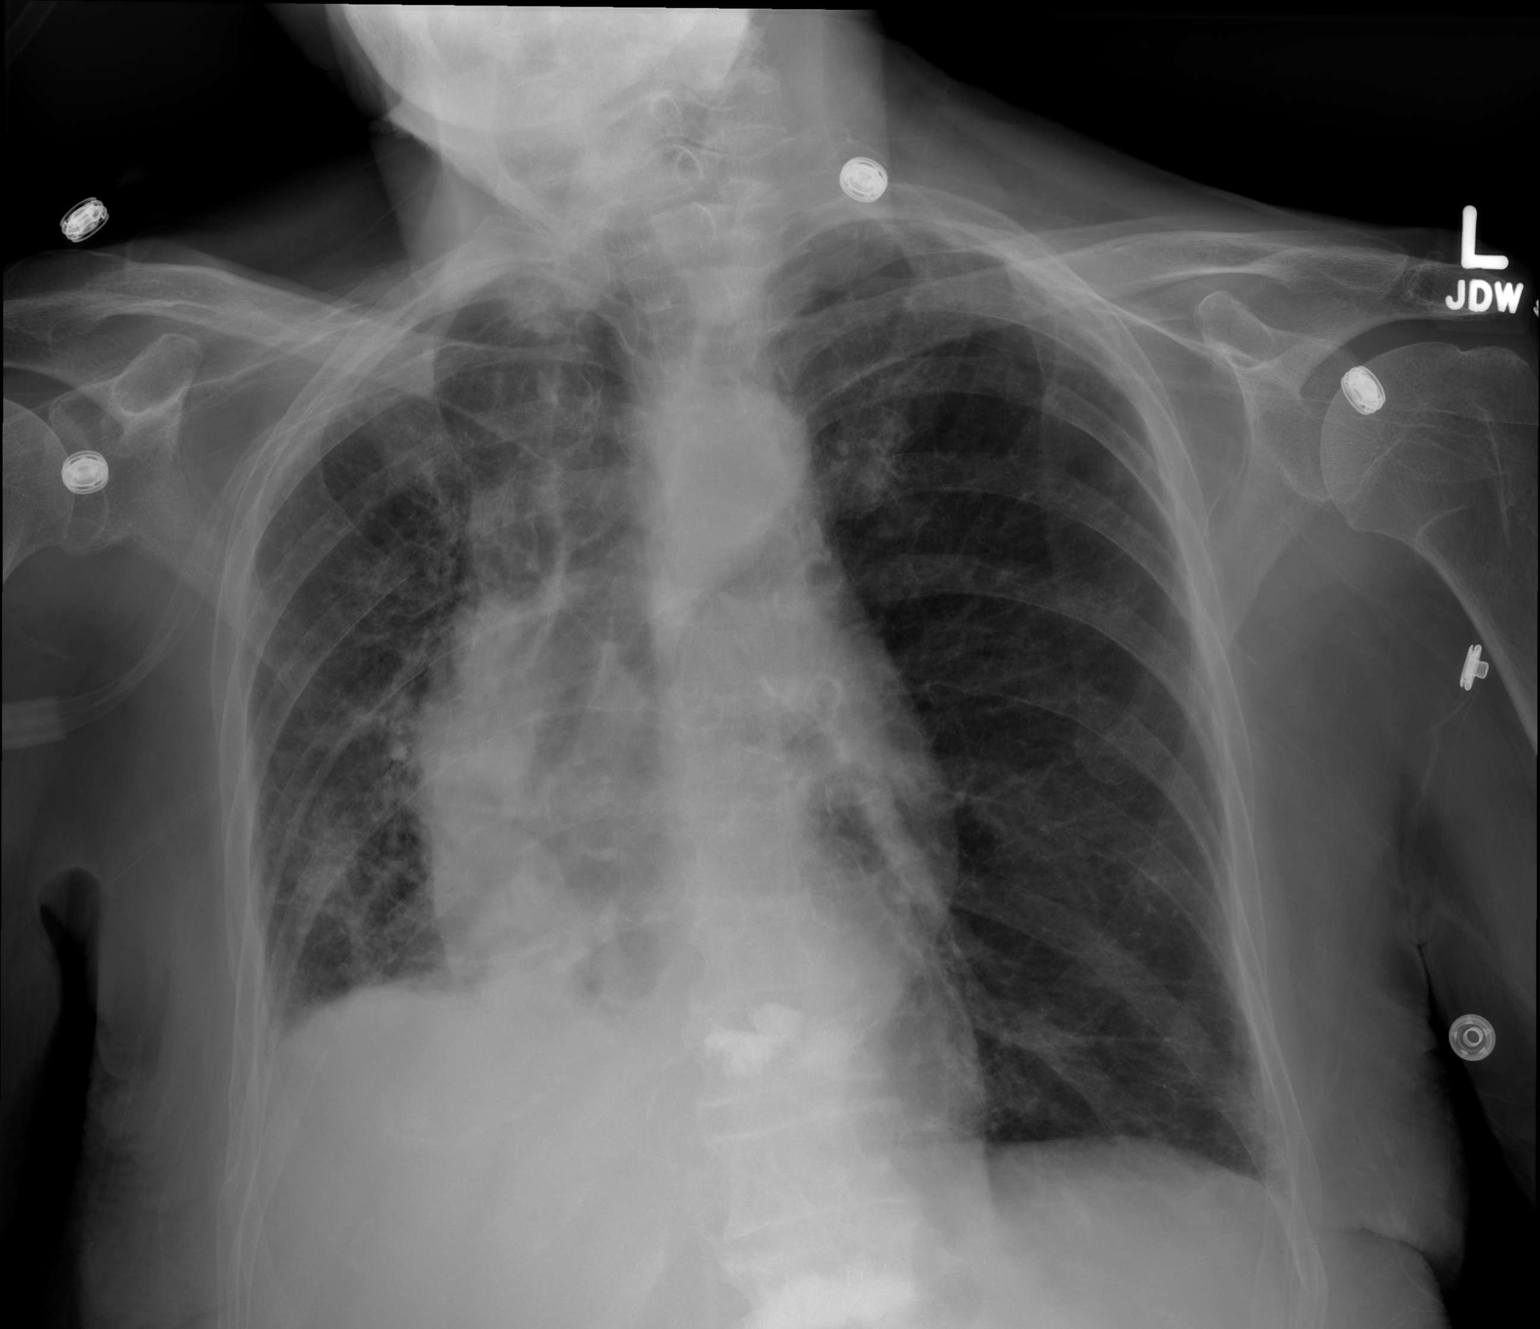

[w chest lat]
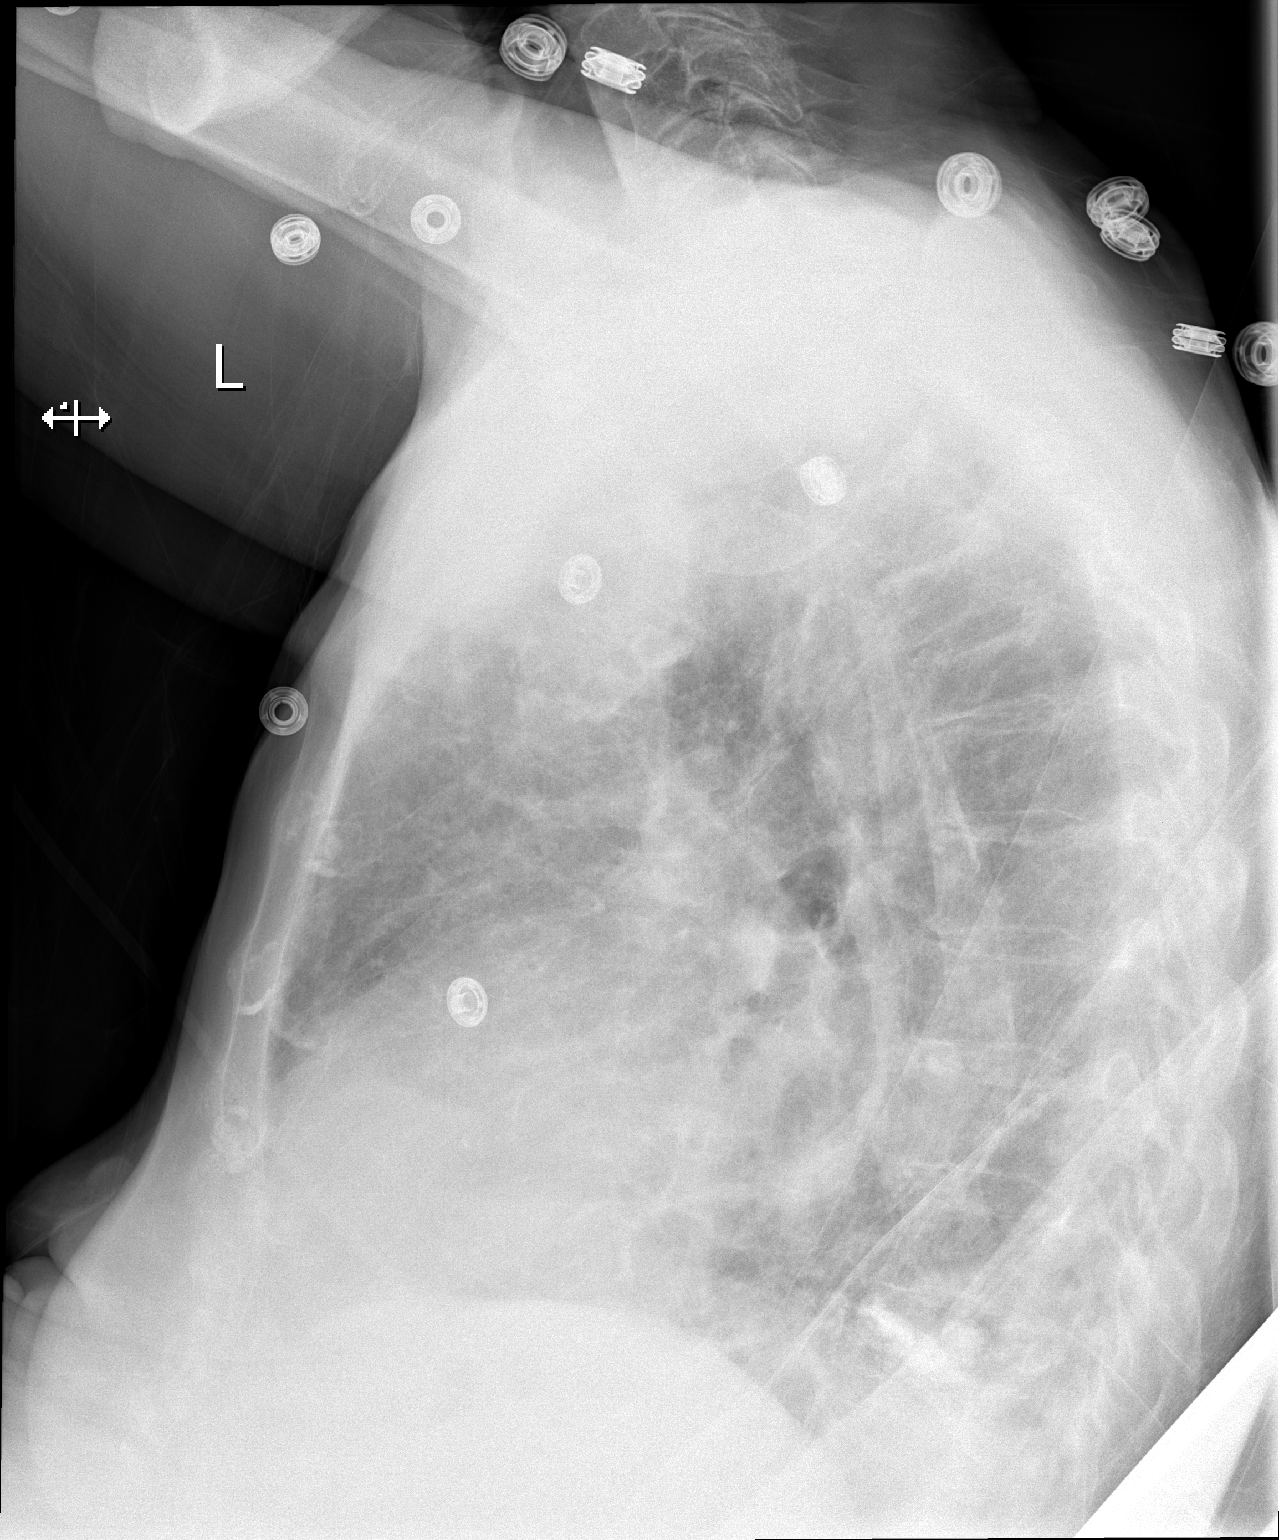

[2 of 2 positions shown; findings below may reference images not displayed]

FINDINGS: There is persistent volume loss and atelectasis in the
right middle and lower lobes.  Elevation of the right hemidiaphragm
is unchanged.  Probable small right pleural effusion.  Left lung
appears relatively clear.  Heart size is within normal limits. The
patient is rotated to the right on today's exam, resulting in
distortion of the mediastinal contours and reduced diagnostic
sensitivity and specificity for mediastinal pathology.
Atherosclerosis in the thoracic aorta.  Post vertebroplasty changes
in the lower thoracic spine.
IMPRESSION: 1.  Allowing for slight differences in patient positioning, the
radiographic appearance of the chest is very similar to prior
examinations, demonstrating persistent atelectasis and
consolidation in the right middle and lower lobes, as above.
Continued attention on follow-up studies is recommended to ensure
resolution of these findings.

## 2013-10-05 MED ORDER — IPRATROPIUM-ALBUTEROL 0.5-2.5 (3) MG/3ML IN SOLN
3.0000 mL | Freq: Three times a day (TID) | RESPIRATORY_TRACT | Status: DC
  Administered 2013-10-05: 3 mL via RESPIRATORY_TRACT
  Filled 2013-10-05: qty 3

## 2013-10-05 MED ORDER — COLLAGENASE 250 UNIT/GM EX OINT
TOPICAL_OINTMENT | Freq: Every day | CUTANEOUS | Status: DC
  Administered 2013-10-05: 1 via TOPICAL
  Administered 2013-10-06 – 2013-10-10 (×5): via TOPICAL
  Filled 2013-10-05 (×2): qty 30

## 2013-10-05 MED ORDER — POTASSIUM CL IN DEXTROSE 5% 20 MEQ/L IV SOLN
20.0000 meq | INTRAVENOUS | Status: DC
  Administered 2013-10-05 – 2013-10-06 (×2): 20 meq via INTRAVENOUS
  Filled 2013-10-05 (×4): qty 1000

## 2013-10-05 MED ORDER — LEVOTHYROXINE SODIUM 50 MCG PO TABS
50.0000 ug | ORAL_TABLET | Freq: Every day | ORAL | Status: DC
  Administered 2013-10-05 – 2013-10-09 (×5): 50 ug via ORAL
  Filled 2013-10-05 (×7): qty 1

## 2013-10-05 MED ORDER — HYDRALAZINE HCL 20 MG/ML IJ SOLN: 10.0000 mg | Freq: Four times a day (QID) | INTRAMUSCULAR | Status: DC | PRN

## 2013-10-05 MED ORDER — RISPERIDONE 0.5 MG PO TABS
0.5000 mg | ORAL_TABLET | Freq: Two times a day (BID) | ORAL | Status: DC
Start: 2013-10-06 — End: 2013-10-06
  Filled 2013-10-05: qty 1

## 2013-10-05 MED ORDER — METOPROLOL TARTRATE 12.5 MG HALF TABLET
12.5000 mg | ORAL_TABLET | Freq: Two times a day (BID) | ORAL | Status: DC
  Administered 2013-10-05 – 2013-10-10 (×10): 12.5 mg via ORAL
  Filled 2013-10-05 (×12): qty 1

## 2013-10-05 MED ORDER — POTASSIUM CHLORIDE 10 MEQ/100ML IV SOLN
10.0000 meq | INTRAVENOUS | Status: AC
  Administered 2013-10-05 (×4): 10 meq via INTRAVENOUS
  Filled 2013-10-05 (×4): qty 100

## 2013-10-05 MED ORDER — IPRATROPIUM-ALBUTEROL 0.5-2.5 (3) MG/3ML IN SOLN
3.0000 mL | RESPIRATORY_TRACT | Status: DC
Start: 2013-10-05 — End: 2013-10-05
  Filled 2013-10-05: qty 3

## 2013-10-05 NOTE — Evaluation (Signed)
Clinical/Bedside Swallow Evaluation Patient Details  Name: [name] Davidson MRN: [id number] Date of Birth: 1922/01/05  Today's Date: 10/05/2013 Time: 0942-1000 SLP Time Calculation (min): 18 min  Past Medical History:  Past Medical History  Diagnosis Date  . Dementia   . Osteoarthritis   . Hypertension   . Thyroid disease   . Renal disorder    Past Surgical History:  Past Surgical History  Procedure Laterality Date  . Cyst removal neck    . Total abdominal hysterectomy    . Cholecystectomy     HPI:  80 year old white female presents emergency department via EMS with chief complaint of altered mental status. History obtained from the patient's daughter who is her primary caregiver. Per her daughter the patient is prone to urinary tract infections. She reports the patient has been lethargic and altered for 2 daysPatient has a past medical history of dementia, osteoarthritis, hypertension, thyroid disease, renal disorder, multiple UTIs in the past. She is bedridden for the most part but is able to ambulate minimally with assistance and walker at baseline. Dx include Sepsis - uncertain etiology: possibly influenza versus aspiration pneumonia; acute encephalopathy; hypernatremia with severe dehydration    Assessment / Plan / Recommendation Clinical Impression  Pt requesting water upon entering room.  Presents with functional oropharyngeal swallow.  There is mildly prolonged oral preparation of mechanical consistencies; swallow is audible, but not followed by s/s of aspiration.  There was notable belching during the evaluation, no regurgitation.  Pt stated she was uncomfortable but could not provide further explanation.  Overall attention to POs and ability to swallow was quite functional given dementia.   Pt with no dx of GERD or esophageal deficits, but symptoms suggest otherwise.    Recommend initiating a dysphagia 3 diet with thin liquids; give meds whole in puree; sit upright as much as  able.  No SLP f/u needed - will sign off.     Aspiration Risk  Mild    Diet Recommendation Dysphagia 3 (Mechanical Soft);Thin liquid   Liquid Administration via: Cup;Straw Medication Administration: Whole meds with puree Supervision: Staff to assist with self feeding Postural Changes and/or Swallow Maneuvers: Seated upright 90 degrees;Upright 30-60 min after meal    Other  Recommendations Oral Care Recommendations: Oral care BID   Follow Up Recommendations  None           SLP Swallow Goals     Swallow Study Prior Functional Status       General Date of Onset: 10/04/13 HPI: 80 year old white female presents emergency department via EMS with chief complaint of altered mental status. History obtained from the patient's daughter who is her primary caregiver. Per her daughter the patient is prone to urinary tract infections. She reports the patient has been lethargic and altered for 2 daysPatient has a past medical history of dementia, osteoarthritis, hypertension, thyroid disease, renal disorder, multiple UTIs in the past. She is bedridden for the most part but is able to ambulate minimally with assistance and walker at baseline. Dx include Sepsis - uncertain etiology: possibly influenza versus aspiration pneumonia; acute encephalopathy; hypernatremia with severe dehydration  Type of Study: Bedside swallow evaluation Previous Swallow Assessment: none per records Diet Prior to this Study: NPO Temperature Spikes Noted: No Respiratory Status: Nasal cannula Behavior/Cognition: Alert;Cooperative;Confused;Doesn't follow directions Oral Cavity - Dentition: Missing dentition Self-Feeding Abilities: Needs assist Patient Positioning: Partially reclined Baseline Vocal Quality: Clear Volitional Cough: Strong Volitional Swallow: Unable to elicit    Oral/Motor/Sensory Function Overall Oral Motor/Sensory Function:  (mild  decreased NL fold right, ?asymm right lower face)   Ice Chips Ice chips:  Within functional limits Presentation: Spoon   Thin Liquid Thin Liquid: Within functional limits Presentation: Straw;Cup    Nectar Thick Nectar Thick Liquid: Not tested   Honey Thick Honey Thick Liquid: Not tested   Puree Puree: Impaired Presentation: Spoon Oral Phase Functional Implications: Prolonged oral transit   Solid  [name], [place] CCC/SLP Pager [phone]     Solid: Impaired Oral Phase Functional Implications:  (prolonged oral prep)       [name], [name] 10/05/2013,10:05 AM

## 2013-10-05 NOTE — Progress Notes (Signed)
INITIAL NUTRITION ASSESSMENT  DOCUMENTATION CODES Per approved criteria  -Severe malnutrition in the context of chronic illness   INTERVENTION: Once diet order permits, recommend Ensure Complete po BID, each supplement provides 350 kcal and 13 grams of protein. Monitor magnesium, potassium, and phosphorus daily for at least 3 days, MD to replete as needed, as pt is at risk for refeeding syndrome given severe malnutrition. RD to continue to follow nutrition care plan.  NUTRITION DIAGNOSIS: Inadequate oral intake related to dementia and poor appetite as evidenced by dehydration and severe fat/muscle mass wasting.   Goal: Diet advancement as tolerated. Maximize intake within GOC.  Monitor:  weight trends, lab trends, I/O's, diet advancement, PO intake  Reason for Assessment: Low Braden Score  80 y.o. female  Admitting Dx: Sepsis  ASSESSMENT: PMHx significant for dementia, osteoarthritis, HTN, thyroid disease, renal disorder. Pt is from home with daughter; primarily bedridden. Admitted with AMS x 2 hr and febrile. Work-up reveals CAP, severe dehydration. Influenza panel pending.  Per chart review, pt on Remeron at home. Currently NPO. SLP just completed BSE, recommendations are for Dysphagia 3 diet with thin liquids. Pt is unable to answer my questions 2/2 dementia. Very pleasant. No family at bedside.  Nutrition Focused Physical Exam:  Subcutaneous Fat:  Orbital Region: severe depletion Upper Arm Region: severe depletion Thoracic and Lumbar Region: severe depletion  Muscle:  Temple Region: severe depletion Clavicle Bone Region: severe depletion Clavicle and Acromion Bone Region: severe depletion Scapular Bone Region: severe depletion Dorsal Hand: severe depletion Patellar Region: severe depletion Anterior Thigh Region: severe depletion Posterior Calf Region: severe depletion  Edema: n/a  Pt meets criteria for severe MALNUTRITION in the context of chronic illness as  evidenced by severe fat and muscle mass loss. Also, suspect intake of <75% x at least 1 month.  Admission sodium was 160, currently down to 157. Pt is currently receiving 1/2 NS at 125 ml/hr. Potassium low at 3.5 and trending down. Most recent CBG's elevated at 148 and 132.  Height: Ht Readings from Last 1 Encounters:  10/04/13 5' (1.524 m)    Weight: Wt Readings from Last 1 Encounters:  10/04/13 97 lb (44 kg)    Ideal Body Weight: 100 lb  % Ideal Body Weight: 97%  Wt Readings from Last 10 Encounters:  10/04/13 97 lb (44 kg)  08/24/12 113 lb (51.256 kg)  07/25/12 114 lb (51.71 kg)  06/25/12 109 lb 2 oz (49.5 kg)    Usual Body Weight: n/a  % Usual Body Weight: n/a  BMI:  Body mass index is 18.94 kg/(m^2). Normal weight  Estimated Nutritional Needs: Kcal: 1400 - 1600 Protein: 50 - 65 g Fluid: 1.4 - 1.6 liters  Skin:  Stage I pressure ulcer to R ankle Stage I pressure ulcer to L ankle R buttocks partial thickness wound Blister to R shoulder  Diet Order: NPO  EDUCATION NEEDS: -No education needs identified at this time   Intake/Output Summary (Last 24 hours) at 10/05/13 0953 Last data filed at 10/05/13 0800  Gross per 24 hour  Intake 2002.08 ml  Output      1 ml  Net 2001.08 ml    Last BM: 1/16  Labs:   Recent Labs Lab 10/04/13 1255 10/05/13 0258  NA 160* 157*  K 4.0 3.5*  CL 119* 121*  CO2 22 22  BUN 56* 47*  CREATININE 1.36* 0.88  CALCIUM 9.3 8.0*  GLUCOSE 145* 125*    CBG (last 3)   Recent Labs  10/04/13 1815 10/04/13 2349  GLUCAP 148* 132*    Scheduled Meds: . albuterol  2.5 mg Nebulization TID  . aspirin  300 mg Rectal Daily  . guaiFENesin  600 mg Oral BID  . heparin  5,000 Units Subcutaneous Q8H  . ipratropium  0.5 mg Nebulization TID  . levothyroxine  25 mcg Intravenous QAC breakfast  . metoprolol  2.5 mg Intravenous Q6H  . oseltamivir  30 mg Oral Once  . oseltamivir  30 mg Oral BID  . piperacillin-tazobactam (ZOSYN)   IV  3.375 g Intravenous Q8H  . sodium chloride  3 mL Intravenous Q12H  . [START ON 10/06/2013] vancomycin  1,000 mg Intravenous Q48H    Continuous Infusions: . sodium chloride 125 mL/hr at 10/05/13 0050    Past Medical History  Diagnosis Date  . Dementia   . Osteoarthritis   . Hypertension   . Thyroid disease   . Renal disorder     Past Surgical History  Procedure Laterality Date  . Cyst removal neck    . Total abdominal hysterectomy    . Cholecystectomy      Jarold Motto MS, RD, LDN Pager: [phone] After-hours pager: [phone]

## 2013-10-05 NOTE — Consult Note (Signed)
WOC wound consult note Reason for Consult: sacral wound, however pressure ulcer is actually right ischium. Other turning surfaces are intact as well as heels.  She does have one dark, maroon in color area on the right lateral malleolus but it is blanchable  Wound type: Unstageable Pressure Ulcer-right ischium Pressure Ulcer POA: Yes Measurement:4cm x 3.0cm x 0.2cm  Wound bed:100% yellow/black soft eschar, adherent, non fluctuant  Drainage (amount, consistency, odor) minimal, non foul. Periwound: intact, blanchable skin over the sacrum Dressing procedure/placement/frequency:enzymatic debridement ointment to the wound bed to clean away necrotic tissue, cover with moist guaze and change daily. Since pt has 2 intact turning surfaces will not order air mattress at the current time.  Optimize nutrition for wound healing.   Discussed POC with patient and bedside nurse.  Re consult if needed, will not follow at this time. Thanks  Johnsie Moscoso [employer] RN, CWOCN [phone])

## 2013-10-05 NOTE — Progress Notes (Signed)
Utilization review completed.  

## 2013-10-05 NOTE — Evaluation (Signed)
Physical Therapy Evaluation Patient Details Name: Ozella Rockslizabeth Decola MRN: [id number] DOB: 08/25/1922 Today's Date: 10/05/2013 Time: [id number] PT Time Calculation (min): 18 min  PT Assessment / Plan / Recommendation History of Present Illness  Ozella Rockslizabeth Hiebert is a 80 y.o. female, with a past medical history of dementia hypertension, thyroid disease, right middle lung lobe collapse, sacral decubitus ulcer, and dehydration who presented to the emergency department with lethargy and altered mental status. The patient lives at home with her daughter. She is primarily bedridden but able to take some steps with assistance. In the emergency department she was found to be febrile with a temp of 101, tachycardic, and tachypnea. Her sodium level is 160. She appeared severely dehydrated.  Clinical Impression  Pt adm due to the above. Presents with limitations in mobility and independence with transfers secondary to deficits indicated below  ( see PT problem list). Pt to benefit from skilled acute PT to address deficits listed below, increase patient's independence with mobility and decrease caregiver burden. Pt would benefit from SNF upon acute D/C; however, daughter is adamant about pt returning home. Will recommend W/C as prior form of mobiility; possible need for hoyer lift for OOB activities.     PT Assessment  Patient needs continued PT services    Follow Up Recommendations  Home health PT;Supervision/Assistance - 24 hour    Does the patient have the potential to tolerate intense rehabilitation      Barriers to Discharge        Equipment Recommendations  Wheelchair (measurements PT);Wheelchair cushion (measurements PT);Other (comment) (possible lift; pending rehab progress)    Recommendations for Other Services OT consult   Frequency Min 3X/week    Precautions / Restrictions Precautions Precautions: Fall Precaution Comments: daughter reports multiple falls in past few  months Restrictions Weight Bearing Restrictions: No   Pertinent Vitals/Pain VSS.      Mobility  Bed Mobility Overal bed mobility: Needs Assistance Bed Mobility: Supine to Sit;Sit to Supine Supine to sit: HOB elevated;Total assist Sit to supine: Total assist General bed mobility comments: pt was able to minimally advance LEs towards EOB; has difficulty sequencing tasks; required max (A) to achieve upright sitting position with use of draw pad; max cues and encouragement; pt c/o "bottom hurting" during bed mobility; pt appeared to have had BM; pt unsafe to transfer due to resistance to therapy; [facility] (A) RN made aware  Transfers General transfer comment: pt will require 2+ (A) for safety          PT Diagnosis: Difficulty walking;Generalized weakness;Acute pain  PT Problem List: Decreased strength;Decreased activity tolerance;Decreased balance;Decreased mobility;Decreased cognition;Decreased knowledge of use of DME;Decreased safety awareness;Decreased knowledge of precautions;Pain PT Treatment Interventions: DME instruction;Gait training;Functional mobility training;Therapeutic exercise;Therapeutic activities;Balance training;Neuromuscular re-education;Patient/family education;Wheelchair mobility training     PT Goals(Current goals can be found in the care plan section) Acute Rehab PT Goals Patient Stated Goal: none stated; the daughter stated "for mom to get moving like she was before she got sick"  PT Goal Formulation: With patient/family Time For Goal Achievement: 10/19/13 Potential to Achieve Goals: Fair  Visit Information  Last PT Received On: 10/05/13 Assistance Needed: +2 History of Present Illness: Ozella Rockslizabeth Kizer is a 80 y.o. female, with a past medical history of dementia hypertension, thyroid disease, right middle lung lobe collapse, sacral decubitus ulcer, and dehydration who presented to the emergency department with lethargy  and altered mental status. The patient lives at home with her daughter.  She is primarily bedridden but able to take some steps with assistance. In the emergency department she was found to be febrile with a temp of 101, tachycardic, and tachypnea. Her sodium level is 160. She appeared severely dehydrated.       Prior Functioning  Home Living Family/patient expects to be discharged to:: Private residence Living Arrangements: Spouse/significant other Available Help at Discharge: Family;Available 24 hours/day Type of Home: House Home Access: Level entry Home Layout: One level Home Equipment: Walker - 2 wheels;Bedside commode;Tub bench Additional Comments: daughter is pt caregiver and also provides care for her husband and daughter  Prior Function Level of Independence: Needs assistance Gait / Transfers Assistance Needed: pt primarily bedridden; daughter reports up until recently pt was ambulating with RW and with her (A) to/from bathroom and was able to ambulate with guidance short distances in community  ADL's / Homemaking Assistance Needed: has required total (A) as of recently per daughter  Communication Communication: HOH    Cognition  Cognition Arousal/Alertness: Awake/alert Behavior During Therapy: WFL for tasks assessed/performed Overall Cognitive Status: History of cognitive impairments - at baseline (Dementia)    Extremity/Trunk Assessment Upper Extremity Assessment Upper Extremity Assessment: Defer to OT evaluation Lower Extremity Assessment Lower Extremity Assessment: Generalized weakness Cervical / Trunk Assessment Cervical / Trunk Assessment: Kyphotic   Balance Balance Overall balance assessment: History of Falls;Needs assistance Sitting-balance support: Feet unsupported;No upper extremity supported Sitting balance-Leahy Scale: Poor Sitting balance - Comments: pt unable to support herself when sitting EOB was constantly pushing posteriorly to lay on bed; tolerated  sitting EOB <5 min  Postural control: Posterior lean  End of Session PT - End of Session Equipment Utilized During Treatment: Oxygen Activity Tolerance: Patient limited by fatigue;Patient limited by pain Patient left: in bed;with call bell/phone within reach;with family/visitor present;with nursing/sitter in room Nurse Communication: Mobility status;Other (comment) (pt had BM)  GP     Shelva Majestic [place], [place] [phone] 10/05/2013, 3:54 PM

## 2013-10-05 NOTE — Care Management Note (Signed)
    Page 1 of 1   10/05/2013     3:27:22 PM   CARE MANAGEMENT NOTE 10/05/2013  Patient:  [name] Davidson   Account Number:  [id number]  Date Initiated:  10/05/2013  Documentation initiated by:  [name] Davidson  Subjective/Objective Assessment:   Pt admitted with sepsis     Action/Plan:   PTA pt lived at home with daughter and family   Anticipated DC Date:  10/08/2013   Anticipated DC Plan:        DC Planning Services  CM consult      Choice offered to / List presented to:             Status of service:  In process, will continue to follow Medicare Important Message given?   (If response is "NO", the following Medicare IM given date fields will be blank) Date Medicare IM given:   Date Additional Medicare IM given:    Discharge Disposition:    Per UR Regulation:  Reviewed for med. necessity/level of care/duration of stay  If discussed at Long Length of Stay Meetings, dates discussed:    Comments:  10/05/13- 1500- [name] PieriniKristi Treyshon Buchanon RN, BSN [phone] Referral received for home needs- spoke wtih pt's daughter [name] Davidson at bedside- per conversation- daughter states that she cares for  other family members other than her mother that are disabled- including her spouse. Unforturnatley insurance/Medicare does not cover "assistance" in the home to help care family- explained this to daughter who understands that she would have to pay out of pocket for assistance - she is interested in list of agencies that provide services- list of private duty care agencies given to her for reference. Discussed if [facility]-SNF for pt might would be an option but she stated that pt had been in one in past and did not like it- she feels like pt would not agree to go. She has needed equipment at home- to include RW, W/C, BSC, and tub bench. NCM to f/u on PT/OT eval recommendations.

## 2013-10-05 NOTE — Progress Notes (Signed)
[facility] PROGRESS NOTE  [name] Davidson UJW:[id number] DOB: 19-Oct-1921 DOA: 10/04/2013  PCP: Pamelia Hoit, MD  Brief HPI: [name] Davidson is a 80 y.o. female, with a past medical history of dementia hypertension, thyroid disease, right middle lung lobe collapse, sacral decubitus ulcer, and dehydration who presented to the emergency department with lethargy and altered mental status. The patient lives at home with her daughter. She is primarily bedridden but able to take some steps with assistance. In the emergency department she was found to be febrile with a temp of 101, tachycardic, and tachypnea. Her sodium level is 160. She appeared severely dehydrated.  Past medical history:  Past Medical History  Diagnosis Date  . Dementia   . Osteoarthritis   . Hypertension   . Thyroid disease   . Renal disorder     Consultants: None  Procedures: None  Antibiotics: Vanc 1/15--> Zosyn 1/15--> Tamiflu 1/15-->  Subjective: Patient alert but confused. Denies any pain.   Objective: Vital Signs  Filed Vitals:   10/05/13 0745 10/05/13 0812 10/05/13 0900 10/05/13 1120  BP:  111/90  134/60  Pulse:  65 76 64  Temp: 97.8 F (36.6 C)     TempSrc: Oral     Resp:   23 19  Height:      Weight:      SpO2:  97% 98% 99%    Intake/Output Summary (Last 24 hours) at 10/05/13 1249 Last data filed at 10/05/13 1136  Gross per 24 hour  Intake 2667.08 ml  Output      1 ml  Net 2666.08 ml   Filed Weights   10/04/13 1700  Weight: 44 kg (97 lb)    General appearance: alert, cooperative, appears stated age and no distress Head: Normocephalic, without obvious abnormality, atraumatic Resp: crackles at bases bilaterally. No wheezing. Cardio: regular rate and rhythm, S1, S2 normal, no murmur, click, rub or gallop GI: soft, non-tender; bowel sounds normal; no masses,  no organomegaly Extremities: extremities normal, atraumatic, no cyanosis or edema Pulses: 2+ and symmetric Skin:  decube in sacral area Neurologic: Alert. Disoriented. But knew she was in the hospital. No focal deficits.  Lab Results:  Basic Metabolic Panel:  Recent Labs Lab 10/04/13 1255 10/05/13 0258  NA 160* 157*  K 4.0 3.5*  CL 119* 121*  CO2 22 22  GLUCOSE 145* 125*  BUN 56* 47*  CREATININE 1.36* 0.88  CALCIUM 9.3 8.0*   Liver Function Tests:  Recent Labs Lab 10/04/13 1255  AST 57*  ALT 24  ALKPHOS 43  BILITOT 0.5  PROT 7.0  ALBUMIN 3.4*   CBC:  Recent Labs Lab 10/04/13 1255 10/05/13 0258  WBC 15.7* 14.9*  NEUTROABS 13.4*  --   HGB 16.6* 13.6  HCT 50.0* 42.0  MCV 101.0* 101.2*  PLT 212 158   Cardiac Enzymes:  Recent Labs Lab 10/04/13 1820  TROPONINI <0.30   CBG:  Recent Labs Lab 10/04/13 1815 10/04/13 2349 10/05/13 1131  GLUCAP 148* 132* 127*    Recent Results (from the past 240 hour(s))  CULTURE, BLOOD (ROUTINE X 2)     Status: None   Collection Time    10/04/13 12:57 PM      Result Value Range Status   Specimen Description BLOOD LEFT FOREARM   Final   Special Requests BOTTLES DRAWN AEROBIC AND ANAEROBIC 10CC   Final   Culture  Setup Time     Final   Value: 10/04/2013 16:26     Performed at [facility]   Culture     Final   Value:        BLOOD CULTURE RECEIVED NO GROWTH TO DATE CULTURE WILL BE HELD FOR 5 DAYS BEFORE ISSUING A FINAL NEGATIVE REPORT     [facility]   Report Status PENDING   Incomplete  URINE CULTURE     Status: None   Collection Time    10/04/13  1:28 PM      Result Value Range Status   Specimen Description URINE, CATHETERIZED   Final   Special Requests NONE   Final   Culture  Setup Time     Final   Value: 10/04/2013 14:42     Performed at [facility]   Colony Count     Final   Value: NO GROWTH     Performed at [facility]   Culture     Final   Value: NO GROWTH     Performed at [facility]   Report Status 10/05/2013 FINAL   Final  CULTURE, BLOOD (ROUTINE X 2)      Status: None   Collection Time    10/04/13  1:40 PM      Result Value Range Status   Specimen Description BLOOD ARM LEFT   Final   Special Requests BOTTLES DRAWN AEROBIC ONLY 5CC   Final   Culture  Setup Time     Final   Value: 10/04/2013 16:26     Performed at [facility]   Culture     Final   Value:        BLOOD CULTURE RECEIVED NO GROWTH TO DATE CULTURE WILL BE HELD FOR 5 DAYS BEFORE ISSUING A FINAL NEGATIVE REPORT     Performed at [facility]   Report Status PENDING   Incomplete  MRSA PCR SCREENING     Status: None   Collection Time    10/04/13  4:39 PM      Result Value Range Status   MRSA by PCR NEGATIVE  NEGATIVE Final   Comment:            The GeneXpert MRSA Assay (FDA     approved for NASAL specimens     only), is one component of a     comprehensive MRSA colonization     surveillance program. It is not     intended to diagnose MRSA     infection nor to guide or     monitor treatment for     MRSA infections.      Studies/Results: Portable Chest 1 View  10/05/2013   CLINICAL DATA:  Altered mental status.  EXAM: PORTABLE CHEST - 1 VIEW  COMPARISON:  10/04/2013.  FINDINGS: Mediastinum and hilar structures are normal. Heart size and pulmonary vascularity stable. Mild atelectatic changes in both lung bases. Mild pneumonitis in the bases cannot be excluded. No pleural effusion or pneumothorax. Tiny calcified pulmonary nodules consistent with granulomas . Prior thoracoplasty. No acute osseous abnormality.  IMPRESSION: Persistent mild basilar atelectasis. Mild basilar pneumonitis cannot be excluded.   Electronically Signed   By: Maisie Fushomas  Register   On: 10/05/2013 07:33   Dg Chest Port 1 View  10/04/2013   CLINICAL DATA:  Altered mental status  EXAM: PORTABLE CHEST - 1 VIEW  COMPARISON:  Portable exam 1252 hr compared to 08/24/2012  FINDINGS: Rotation to the right.  Upper normal heart size.  Atherosclerotic calcification aorta.  Mediastinal contours and  pulmonary vascularity  otherwise normal.  Emphysematous and bronchitic changes with minimal right basilar atelectasis.  No acute infiltrate, pleural effusion, or pneumothorax.  Diffuse osseous demineralization with prior spinal augmentation procedure at lower thoracic spine.  Calcified granuloma right apex.  Skin folds project over left chest.  IMPRESSION: COPD changes with minimal right basilar atelectasis.   Electronically Signed   By: Ulyses Southward M.D.   On: 10/04/2013 13:05    Medications:  Scheduled: . albuterol  2.5 mg Nebulization TID  . aspirin  300 mg Rectal Daily  . collagenase   Topical Daily  . guaiFENesin  600 mg Oral BID  . heparin  5,000 Units Subcutaneous Q8H  . ipratropium  0.5 mg Nebulization TID  . levothyroxine  25 mcg Intravenous QAC breakfast  . metoprolol  2.5 mg Intravenous Q6H  . oseltamivir  30 mg Oral Once  . oseltamivir  30 mg Oral BID  . piperacillin-tazobactam (ZOSYN)  IV  3.375 g Intravenous Q8H  . potassium chloride  10 mEq Intravenous Q1 Hr x 4  . sodium chloride  3 mL Intravenous Q12H  . [START ON 10/06/2013] vancomycin  1,000 mg Intravenous Q48H   Continuous: . dextrose 5 % with KCl 20 mEq / L     ZOX:[id number], acetaminophen, alum & mag hydroxide-simeth, guaiFENesin-dextromethorphan, haloperidol lactate, morphine injection, ondansetron (ZOFRAN) IV, ondansetron  Assessment/Plan:  Principal Problem:   Sepsis Active Problems:   HTN (hypertension)   Hypothyroidism   Dementia   Dehydration   Pneumonia   Hypernatremia   Lethargy   SIRS (systemic inflammatory response syndrome)    Sepsis: Possibly influenza versus aspiration pneumonia.  Blood and urine cultures are pending. Patient seems to have improved. More alert today. Continue broad spectrum coverage for now.   Acute encephalopathy  Secondary to infection, dehydration and hypernatremia. Improved this AM. Continue to monitor. No need for further imaging studies.  Community acquired  pneumonia with possible influenza  Patient has been started on vancomycin, Zosyn, Tamiflu. Influenza PCR pending. Followup chest x-ray shows possible pneumonitis.   COPD with smoking history, and history of lung collapse  Scheduled Nebulizers. PRN oxygen. Mucolytics   Hypernatremia with severe dehydration and ARF Slight improvement compared to yesterday. She appears to be better hydrated. Will change to D5 for now. Repeat Bmet. Seen by SLP and able to take orally now. Renal function is better.  Hypothyroidism  Continue Synthroid.   Hypertension  Monitor BP. Holding oral agents. Resume from AM. BP stable. Stop IV BB.  Patient on several psychotropics - possibly for dementia +/- agitation  Mental status has improved. Incremental initiation of her home medications.  Suspected failure to thrive  Long-standing history of dementia, suspect very poor functional status at baseline. PT/OT.  Code Status: Full Code  DVT Prophylaxis: Heparin    Family Communication: No family at bedside. Will call.  Disposition Plan: Should be able to transfer to floor today if repeat bmet is stable or better.    LOS: 1 day   [facility]  [facility] Pager [phone] 10/05/2013, 12:49 PM  If 8PM-8AM, please contact night-coverage at [email or web address], password [facility]

## 2013-10-06 DIAGNOSIS — I10 Essential (primary) hypertension: Secondary | ICD-10-CM

## 2013-10-06 DIAGNOSIS — J189 Pneumonia, unspecified organism: Secondary | ICD-10-CM

## 2013-10-06 LAB — BASIC METABOLIC PANEL
BUN: 17 mg/dL (ref 6–23)
CHLORIDE: 107 meq/L (ref 96–112)
CO2: 21 meq/L (ref 19–32)
Calcium: 7.8 mg/dL — ABNORMAL LOW (ref 8.4–10.5)
Creatinine, Ser: 0.55 mg/dL (ref 0.50–1.10)
GFR calc Af Amer: >90 mL/min (ref >90)
GFR calc non Af Amer: 81 mL/min — ABNORMAL LOW (ref >90)
GLUCOSE: 124 mg/dL — AB (ref 70–99)
POTASSIUM: 3.2 meq/L — AB (ref 3.7–5.3)
Sodium: 141 mEq/L (ref 137–147)

## 2013-10-06 LAB — GLUCOSE, CAPILLARY
GLUCOSE-CAPILLARY: 104 mg/dL — AB (ref 70–99)
GLUCOSE-CAPILLARY: 115 mg/dL — AB (ref 70–99)
GLUCOSE-CAPILLARY: 122 mg/dL — AB (ref 70–99)
GLUCOSE-CAPILLARY: 130 mg/dL — AB (ref 70–99)
Glucose-Capillary: 125 mg/dL — ABNORMAL HIGH (ref 70–99)

## 2013-10-06 LAB — CBC
HEMATOCRIT: 41.7 % (ref 36.0–46.0)
Hemoglobin: 14.2 g/dL (ref 12.0–15.0)
MCH: 33.2 pg (ref 26.0–34.0)
MCHC: 34.1 g/dL (ref 30.0–36.0)
MCV: 97.4 fL (ref 78.0–100.0)
Platelets: 141 10*3/uL — ABNORMAL LOW (ref 150–400)
RBC: 4.28 MIL/uL (ref 3.87–5.11)
RDW: 13.1 % (ref 11.5–15.5)
WBC: 12.6 10*3/uL — AB (ref 4.0–10.5)

## 2013-10-06 MED ORDER — GUAIFENESIN 200 MG PO TABS
200.0000 mg | ORAL_TABLET | Freq: Four times a day (QID) | ORAL | Status: DC
  Administered 2013-10-06 – 2013-10-10 (×16): 200 mg via ORAL
  Filled 2013-10-06 (×20): qty 1

## 2013-10-06 MED ORDER — RISPERIDONE 0.25 MG PO TABS
0.2500 mg | ORAL_TABLET | Freq: Two times a day (BID) | ORAL | Status: DC
  Administered 2013-10-06 – 2013-10-10 (×8): 0.25 mg via ORAL
  Filled 2013-10-06 (×9): qty 1

## 2013-10-06 MED ORDER — IPRATROPIUM-ALBUTEROL 0.5-2.5 (3) MG/3ML IN SOLN
3.0000 mL | RESPIRATORY_TRACT | Status: DC | PRN
  Administered 2013-10-07 – 2013-10-08 (×2): 3 mL via RESPIRATORY_TRACT
  Filled 2013-10-06 (×2): qty 3

## 2013-10-06 MED ORDER — IPRATROPIUM-ALBUTEROL 0.5-2.5 (3) MG/3ML IN SOLN: 3.0000 mL | Freq: Two times a day (BID) | RESPIRATORY_TRACT | Status: DC

## 2013-10-06 MED ORDER — MEMANTINE HCL 10 MG PO TABS
10.0000 mg | ORAL_TABLET | Freq: Two times a day (BID) | ORAL | Status: DC
  Administered 2013-10-06 – 2013-10-10 (×9): 10 mg via ORAL
  Filled 2013-10-06 (×10): qty 1

## 2013-10-06 MED ORDER — POTASSIUM CL IN DEXTROSE 5% 20 MEQ/L IV SOLN
20.0000 meq | INTRAVENOUS | Status: DC
  Administered 2013-10-06 – 2013-10-09 (×3): 20 meq via INTRAVENOUS
  Filled 2013-10-06 (×4): qty 1000

## 2013-10-06 MED ORDER — POTASSIUM CHLORIDE 10 MEQ/100ML IV SOLN
10.0000 meq | INTRAVENOUS | Status: AC
  Administered 2013-10-06 (×4): 10 meq via INTRAVENOUS
  Filled 2013-10-06 (×4): qty 100

## 2013-10-06 NOTE — Progress Notes (Signed)
[facility] PROGRESS NOTE  [name] Davidson RCV:[id number] DOB: [birth year] DOA: 10/04/2013  PCP: Woody Seller, MD  Brief HPI: [name] Davidson is a 80 y.o. female, with a past medical history of dementia hypertension, thyroid disease, right middle lung lobe collapse, sacral decubitus ulcer, and dehydration who presented to the emergency department with lethargy and altered mental status. The patient lives at home with her daughter. She is primarily bedridden but able to take some steps with assistance. In the emergency department she was found to be febrile with a temp of 101, tachycardic, and tachypnea. Her sodium level is 160. She appeared severely dehydrated.  Past medical history:  Past Medical History  Diagnosis Date  . Dementia   . Osteoarthritis   . Hypertension   . Thyroid disease   . Renal disorder     Consultants: None  Procedures: None  Antibiotics: Vanc 1/15--> Zosyn 1/15--> Tamiflu 1/15-->1/16  Subjective: Patient alert but confused. Denies any pain.   Objective: Vital Signs  Filed Vitals:   10/05/13 2127 10/05/13 2135 10/06/13 0652 10/06/13 0803  BP:  140/70 121/76 134/68  Pulse: 88 83 79 86  Temp:  98.5 F (36.9 C) 97.2 F (36.2 C) 98.6 F (37 C)  TempSrc:  Axillary Axillary Axillary  Resp: 20 18 18 20   Height:      Weight:   47.3 kg (104 lb 4.4 oz)   SpO2: 98% 97% 100% 98%    Intake/Output Summary (Last 24 hours) at 10/06/13 1207 Last data filed at 10/06/13 0700  Gross per 24 hour  Intake 2002.5 ml  Output      0 ml  Net 2002.5 ml   Filed Weights   10/04/13 1700 10/06/13 0652  Weight: 44 kg (97 lb) 47.3 kg (104 lb 4.4 oz)    General appearance: alert, cooperative, appears stated age and no distress Resp: crackles at bases bilaterally. No wheezing. Improved air entry. Cardio: regular rate and rhythm, S1, S2 normal, no murmur, click, rub or gallop GI: soft, non-tender; bowel sounds normal; no masses,  no organomegaly Extremities:  extremities normal, atraumatic, no cyanosis or edema Neurologic: Alert. Disoriented. No focal deficits.  Lab Results:  Basic Metabolic Panel:  Recent Labs Lab 10/04/13 1255 10/05/13 0258 10/05/13 1340 10/06/13 0526  NA 160* 157* 153* 141  K 4.0 3.5* 3.3* 3.2*  CL 119* 121* 115* 107  CO2 22 22 21 21   GLUCOSE 145* 125* 150* 124*  BUN 56* 47* 36* 17  CREATININE 1.36* 0.88 0.75 0.55  CALCIUM 9.3 8.0* 7.9* 7.8*   Liver Function Tests:  Recent Labs Lab 10/04/13 1255  AST 57*  ALT 24  ALKPHOS 43  BILITOT 0.5  PROT 7.0  ALBUMIN 3.4*   CBC:  Recent Labs Lab 10/04/13 1255 10/05/13 0258 10/06/13 0526  WBC 15.7* 14.9* 12.6*  NEUTROABS 13.4*  --   --   HGB 16.6* 13.6 14.2  HCT 50.0* 42.0 41.7  MCV 101.0* 101.2* 97.4  PLT 212 158 141*   Cardiac Enzymes:  Recent Labs Lab 10/04/13 1820  TROPONINI <0.30   CBG:  Recent Labs Lab 10/05/13 1131 10/05/13 1656 10/05/13 2358 10/06/13 0633 10/06/13 1126  GLUCAP 127* 144* 130* 122* 115*    Recent Results (from the past 240 hour(s))  CULTURE, BLOOD (ROUTINE X 2)     Status: None   Collection Time    10/04/13 12:57 PM      Result Value Range Status   Specimen Description BLOOD LEFT FOREARM   Final  Special Requests BOTTLES DRAWN AEROBIC AND ANAEROBIC 10CC   Final   Culture  Setup Time     Final   Value: 10/04/2013 16:26     Performed at [facility]   Culture     Final   Value: GRAM POSITIVE RODS     Note: Gram Stain Report Called to,Read Back By and Verified With: Donato Heinz [id number] BRMEL     Performed at [facility]   Report Status PENDING   Incomplete  URINE CULTURE     Status: None   Collection Time    10/04/13  1:28 PM      Result Value Range Status   Specimen Description URINE, CATHETERIZED   Final   Special Requests NONE   Final   Culture  Setup Time     Final   Value: 10/04/2013 14:42     Performed at [place]     Final   Value: NO GROWTH      Performed at [facility]   Culture     Final   Value: NO GROWTH     Performed at [facility]   Report Status 10/05/2013 FINAL   Final  CULTURE, BLOOD (ROUTINE X 2)     Status: None   Collection Time    10/04/13  1:40 PM      Result Value Range Status   Specimen Description BLOOD ARM LEFT   Final   Special Requests BOTTLES DRAWN AEROBIC ONLY 5CC   Final   Culture  Setup Time     Final   Value: 10/04/2013 16:26     Performed at [facility]   Culture     Final   Value:        BLOOD CULTURE RECEIVED NO GROWTH TO DATE CULTURE WILL BE HELD FOR 5 DAYS BEFORE ISSUING A FINAL NEGATIVE REPORT     Performed at [facility]   Report Status PENDING   Incomplete  RESPIRATORY VIRUS PANEL     Status: None   Collection Time    10/04/13  3:32 PM      Result Value Range Status   Source - RVPAN NASAL SWAB   Corrected   Comment: CORRECTED ON 01/16 AT 2216: PREVIOUSLY REPORTED AS NASAL SWAB   Respiratory Syncytial Virus A NOT DETECTED   Final   Respiratory Syncytial Virus B NOT DETECTED   Final   Influenza A NOT DETECTED   Final   Influenza B NOT DETECTED   Final   Parainfluenza 1 NOT DETECTED   Final   Parainfluenza 2 NOT DETECTED   Final   Parainfluenza 3 NOT DETECTED   Final   Metapneumovirus NOT DETECTED   Final   Rhinovirus NOT DETECTED   Final   Adenovirus NOT DETECTED   Final   Influenza A H1 NOT DETECTED   Final   Influenza A H3 NOT DETECTED   Final   Comment: (NOTE)           Normal Reference Range for each Analyte: NOT DETECTED     Testing performed using the Luminex xTAG Respiratory Viral Panel test     kit.     This test was developed and its performance characteristics determined     by [facility]. It has not been cleared or approved by the [place]     Food and Drug Administration. This test is used for clinical purposes.  It should not be regarded as investigational or for research. This     laboratory is certified under the [place] (CLIA) as qualified to perform high complexity     clinical laboratory testing.     Performed at [facility] PCR SCREENING     Status: None   Collection Time    10/04/13  4:39 PM      Result Value Range Status   MRSA by PCR NEGATIVE  NEGATIVE Final   Comment:            The GeneXpert MRSA Assay (FDA     approved for NASAL specimens     only), is one component of a     comprehensive MRSA colonization     surveillance program. It is not     intended to diagnose MRSA     infection nor to guide or     monitor treatment for     MRSA infections.      Studies/Results: Portable Chest 1 View  10/05/2013   CLINICAL DATA:  Altered mental status.  EXAM: PORTABLE CHEST - 1 VIEW  COMPARISON:  10/04/2013.  FINDINGS: Mediastinum and hilar structures are normal. Heart size and pulmonary vascularity stable. Mild atelectatic changes in both lung bases. Mild pneumonitis in the bases cannot be excluded. No pleural effusion or pneumothorax. Tiny calcified pulmonary nodules consistent with granulomas . Prior thoracoplasty. No acute osseous abnormality.  IMPRESSION: Persistent mild basilar atelectasis. Mild basilar pneumonitis cannot be excluded.   Electronically Signed   By: Marcello Moores  Register   On: 10/05/2013 07:33   Dg Chest Port 1 View  10/04/2013   CLINICAL DATA:  Altered mental status  EXAM: PORTABLE CHEST - 1 VIEW  COMPARISON:  Portable exam 1252 hr compared to 08/24/2012  FINDINGS: Rotation to the right.  Upper normal heart size.  Atherosclerotic calcification aorta.  Mediastinal contours and pulmonary vascularity otherwise normal.  Emphysematous and bronchitic changes with minimal right basilar atelectasis.  No acute infiltrate, pleural effusion, or pneumothorax.  Diffuse osseous demineralization with prior spinal augmentation procedure at lower thoracic spine.  Calcified granuloma right apex.  Skin folds project over left chest.  IMPRESSION:  COPD changes with minimal right basilar atelectasis.   Electronically Signed   By: Lavonia Dana M.D.   On: 10/04/2013 13:05    Medications:  Scheduled: . collagenase   Topical Daily  . guaiFENesin  600 mg Oral BID  . heparin  5,000 Units Subcutaneous Q8H  . ipratropium-albuterol  3 mL Nebulization TID  . levothyroxine  50 mcg Oral QHS  . metoprolol tartrate  12.5 mg Oral BID  . piperacillin-tazobactam (ZOSYN)  IV  3.375 g Intravenous Q8H  . risperiDONE  0.5 mg Oral BID  . sodium chloride  3 mL Intravenous Q12H  . vancomycin  1,000 mg Intravenous Q48H   Continuous: . dextrose 5 % with KCl 20 mEq / L 20 mEq (10/06/13 0540)   GLO:VFIEPPIRJJOAC, acetaminophen, alum & mag hydroxide-simeth, guaiFENesin-dextromethorphan, haloperidol lactate, hydrALAZINE, morphine injection, ondansetron (ZOFRAN) IV, ondansetron  Assessment/Plan:  Principal Problem:   Sepsis Active Problems:   HTN (hypertension)   Hypothyroidism   Dementia   Dehydration   Pneumonia   Hypernatremia   Lethargy   SIRS (systemic inflammatory response syndrome)    Sepsis possibly due to pneumonia.  Blood and urine cultures are negative so far. Patient seems to have improved. Continue broad spectrum coverage for now.  Acute encephalopathy  Secondary to infection, dehydration and hypernatremia. Continues to improve.   Aspiration versus Community acquired pneumonia Patient was started on vancomycin, Zosyn, Tamiflu. Influenza PCR negative. Tamiflu was stopped. If blood cultures remain negative will change to Augmentin suspension in AM.   COPD with smoking history, and history of lung collapse  Scheduled Nebulizers. PRN oxygen. Mucolytics   Hypernatremia with severe dehydration and ARF Sodium now normal. She appears to be better hydrated. Continue IVF at lower rate. Seen by SLP and able to take orally now. Renal function is now normal.  Hypothyroidism  Continue Synthroid.   Hypertension  Monitor BP. Holding oral  agents. Resume from AM. BP stable.  Patient on several psychotropics - possibly for dementia +/- agitation  Mental status has improved. Incremental initiation of her home medications.  Suspected failure to thrive  Long-standing history of dementia, suspect very poor functional status at baseline. PT/OT.  Code Status: Full Code. Confirmed today as well. DVT Prophylaxis: Heparin    Family Communication: Discussed with daughter.  Disposition Plan: Anticipate discharge in AM. Daughter declines SNF. Will need Home health.    LOS: 2 days   [facility] Pager [phone] 10/06/2013, 12:07 PM  If 8PM-8AM, please contact night-coverage at [email or web address], password [facility]

## 2013-10-07 ENCOUNTER — Inpatient Hospital Stay (HOSPITAL_COMMUNITY): Payer: Medicare HMO

## 2013-10-07 LAB — BASIC METABOLIC PANEL
BUN: 11 mg/dL (ref 6–23)
CO2: 23 mEq/L (ref 19–32)
Calcium: 8.3 mg/dL — ABNORMAL LOW (ref 8.4–10.5)
Chloride: 105 mEq/L (ref 96–112)
Creatinine, Ser: 0.6 mg/dL (ref 0.50–1.10)
GFR, EST NON AFRICAN AMERICAN: 78 mL/min — AB (ref >90)
Glucose, Bld: 100 mg/dL — ABNORMAL HIGH (ref 70–99)
POTASSIUM: 3.9 meq/L (ref 3.7–5.3)
Sodium: 141 mEq/L (ref 137–147)

## 2013-10-07 LAB — GLUCOSE, CAPILLARY
GLUCOSE-CAPILLARY: 131 mg/dL — AB (ref 70–99)
Glucose-Capillary: 95 mg/dL (ref 70–99)
Glucose-Capillary: 115 mg/dL — ABNORMAL HIGH (ref 70–99)
Glucose-Capillary: 150 mg/dL — ABNORMAL HIGH (ref 70–99)

## 2013-10-07 LAB — CBC
HCT: 38.9 % (ref 36.0–46.0)
Hemoglobin: 13.4 g/dL (ref 12.0–15.0)
MCH: 32.8 pg (ref 26.0–34.0)
MCHC: 34.4 g/dL (ref 30.0–36.0)
MCV: 95.1 fL (ref 78.0–100.0)
Platelets: 137 10*3/uL — ABNORMAL LOW (ref 150–400)
RBC: 4.09 MIL/uL (ref 3.87–5.11)
RDW: 12.8 % (ref 11.5–15.5)
WBC: 10.8 10*3/uL — ABNORMAL HIGH (ref 4.0–10.5)

## 2013-10-07 LAB — CULTURE, BLOOD (ROUTINE X 2)

## 2013-10-07 MED ORDER — ENSURE COMPLETE PO LIQD
237.0000 mL | Freq: Three times a day (TID) | ORAL | Status: DC
  Administered 2013-10-07 – 2013-10-10 (×9): 237 mL via ORAL

## 2013-10-07 MED ORDER — MEGESTROL ACETATE 40 MG/ML PO SUSP
200.0000 mg | Freq: Every day | ORAL | Status: DC
  Administered 2013-10-07: 15:00:00 via ORAL
  Administered 2013-10-08 – 2013-10-10 (×3): 200 mg via ORAL
  Filled 2013-10-07 (×5): qty 5

## 2013-10-07 MED ORDER — HYDROCODONE-ACETAMINOPHEN 5-325 MG PO TABS
1.0000 | ORAL_TABLET | Freq: Four times a day (QID) | ORAL | Status: DC | PRN
  Administered 2013-10-07 – 2013-10-09 (×4): 1 via ORAL
  Filled 2013-10-07 (×4): qty 1

## 2013-10-07 NOTE — Progress Notes (Signed)
[facility] PROGRESS NOTE  [name] Davidson TRR:[id number] DOB: 05-07-1922 DOA: 10/04/2013  PCP: Woody Seller, MD  Brief HPI: [name] Davidson is a 80 y.o. female, with a past medical history of dementia hypertension, thyroid disease, right middle lung lobe collapse, sacral decubitus ulcer, and dehydration who presented to the emergency department with lethargy and altered mental status. The patient lives at home with her daughter. She is primarily bedridden but able to take some steps with assistance. In the emergency department she was found to be febrile with a temp of 101, tachycardic, and tachypnea. Her sodium level is 160. She appeared severely dehydrated.  Past medical history:  Past Medical History  Diagnosis Date  . Dementia   . Osteoarthritis   . Hypertension   . Thyroid disease   . Renal disorder     Consultants: None  Procedures: None  Antibiotics: Vanc 1/15--> Zosyn 1/15--> Tamiflu 1/15-->1/16  Subjective: Patient alert but confused. Denies any pain. Daughter at bedside. Informed by RN that patient had fever last night along with chest congestion requiring suctioning.  Objective: Vital Signs  Filed Vitals:   10/06/13 2242 10/06/13 2353 10/07/13 0004 10/07/13 0620  BP: 128/73   141/84  Pulse: 87   78  Temp: 99.1 F (37.3 C) 99 F (37.2 C)  98.7 F (37.1 C)  TempSrc: Axillary Axillary  Axillary  Resp: 16   16  Height:      Weight: 47.5 kg (104 lb 11.5 oz)     SpO2: 96%  98% 100%    Intake/Output Summary (Last 24 hours) at 10/07/13 0757 Last data filed at 10/07/13 0700  Gross per 24 hour  Intake 1601.33 ml  Output      0 ml  Net 1601.33 ml   Filed Weights   10/04/13 1700 10/06/13 0652 10/06/13 2242  Weight: 44 kg (97 lb) 47.3 kg (104 lb 4.4 oz) 47.5 kg (104 lb 11.5 oz)    General appearance: alert, cooperative, appears stated age and no distress Resp: Coarse breath sounds bilaterally with crackles at bases. Slightly worse compared to  yesterday. No wheezing. Improved air entry. Cardio: regular rate and rhythm, S1, S2 normal, no murmur, click, rub or gallop GI: soft, non-tender; bowel sounds normal; no masses,  no organomegaly Extremities: extremities normal, atraumatic, no cyanosis or edema Neurologic: Alert. Disoriented. No focal deficits.  Lab Results:  Basic Metabolic Panel:  Recent Labs Lab 10/04/13 1255 10/05/13 0258 10/05/13 1340 10/06/13 0526  NA 160* 157* 153* 141  K 4.0 3.5* 3.3* 3.2*  CL 119* 121* 115* 107  CO2 _0 GLUCOSE 145* 125* 150* 124*  BUN 56* 47* 36* 17  CREATININE 1.36* 0.88 0.75 0.55  CALCIUM 9.3 8.0* 7.9* 7.8*   Liver Function Tests:  Recent Labs Lab 10/04/13 1255  AST 57*  ALT 24  ALKPHOS 43  BILITOT 0.5  PROT 7.0  ALBUMIN 3.4*   CBC:  Recent Labs Lab 10/04/13 1255 10/05/13 0258 10/06/13 0526  WBC 15.7* 14.9* 12.6*  NEUTROABS 13.4*  --   --   HGB 16.6* 13.6 14.2  HCT 50.0* 42.0 41.7  MCV 101.0* 101.2* 97.4  PLT 212 158 141*   Cardiac Enzymes:  Recent Labs Lab 10/04/13 1820  TROPONINI <0.30   CBG:  Recent Labs Lab 10/06/13 0633 10/06/13 1126 10/06/13 1602 10/06/13 2353 10/07/13 0609  GLUCAP 122* 115* 104* 125* 95    Recent Results (from the past 240 hour(s))  CULTURE, BLOOD (ROUTINE X 2)  Status: None   Collection Time    10/04/13 12:57 PM      Result Value Range Status   Specimen Description BLOOD LEFT FOREARM   Final   Special Requests BOTTLES DRAWN AEROBIC AND ANAEROBIC 10CC   Final   Culture  Setup Time     Final   Value: 10/04/2013 16:26     Performed at [facility]   Culture     Final   Value: GRAM POSITIVE RODS     Note: Gram Stain Report Called to,Read Back By and Verified With: Donato Heinz [id number] BRMEL     Performed at [facility]   Report Status PENDING   Incomplete  URINE CULTURE     Status: None   Collection Time    10/04/13  1:28 PM      Result Value Range Status   Specimen Description  URINE, CATHETERIZED   Final   Special Requests NONE   Final   Culture  Setup Time     Final   Value: 10/04/2013 14:42     Performed at [place]     Final   Value: NO GROWTH     Performed at [facility]   Culture     Final   Value: NO GROWTH     Performed at [facility]   Report Status 10/05/2013 FINAL   Final  CULTURE, BLOOD (ROUTINE X 2)     Status: None   Collection Time    10/04/13  1:40 PM      Result Value Range Status   Specimen Description BLOOD ARM LEFT   Final   Special Requests BOTTLES DRAWN AEROBIC ONLY 5CC   Final   Culture  Setup Time     Final   Value: 10/04/2013 16:26     Performed at [facility]   Culture     Final   Value:        BLOOD CULTURE RECEIVED NO GROWTH TO DATE CULTURE WILL BE HELD FOR 5 DAYS BEFORE ISSUING A FINAL NEGATIVE REPORT     Performed at [facility]   Report Status PENDING   Incomplete  RESPIRATORY VIRUS PANEL     Status: None   Collection Time    10/04/13  3:32 PM      Result Value Range Status   Source - RVPAN NASAL SWAB   Corrected   Comment: CORRECTED ON 01/16 AT 2216: PREVIOUSLY REPORTED AS NASAL SWAB   Respiratory Syncytial Virus A NOT DETECTED   Final   Respiratory Syncytial Virus B NOT DETECTED   Final   Influenza A NOT DETECTED   Final   Influenza B NOT DETECTED   Final   Parainfluenza 1 NOT DETECTED   Final   Parainfluenza 2 NOT DETECTED   Final   Parainfluenza 3 NOT DETECTED   Final   Metapneumovirus NOT DETECTED   Final   Rhinovirus NOT DETECTED   Final   Adenovirus NOT DETECTED   Final   Influenza A H1 NOT DETECTED   Final   Influenza A H3 NOT DETECTED   Final   Comment: (NOTE)           Normal Reference Range for each Analyte: NOT DETECTED     Testing performed using the Luminex xTAG Respiratory Viral Panel test     kit.     This test was developed and its performance characteristics determined  by [facility]. It has not been cleared or approved  by the [place]     Food and Drug Administration. This test is used for clinical purposes.     It should not be regarded as investigational or for research. This     laboratory is certified under the [place] (CLIA) as qualified to perform high complexity     clinical laboratory testing.     Performed at [facility] PCR SCREENING     Status: None   Collection Time    10/04/13  4:39 PM      Result Value Range Status   MRSA by PCR NEGATIVE  NEGATIVE Final   Comment:            The GeneXpert MRSA Assay (FDA     approved for NASAL specimens     only), is one component of a     comprehensive MRSA colonization     surveillance program. It is not     intended to diagnose MRSA     infection nor to guide or     monitor treatment for     MRSA infections.      Studies/Results: No results found.  Medications:  Scheduled: . collagenase   Topical Daily  . feeding supplement (ENSURE COMPLETE)  237 mL Oral TID BM  . guaiFENesin  200 mg Oral Q6H  . heparin  5,000 Units Subcutaneous Q8H  . levothyroxine  50 mcg Oral QHS  . memantine  10 mg Oral BID  . metoprolol tartrate  12.5 mg Oral BID  . piperacillin-tazobactam (ZOSYN)  IV  3.375 g Intravenous Q8H  . risperiDONE  0.25 mg Oral BID  . sodium chloride  3 mL Intravenous Q12H  . vancomycin  1,000 mg Intravenous Q48H   Continuous: . dextrose 5 % with KCl 20 mEq / L 20 mEq (10/06/13 1326)   OXB:[id number], acetaminophen, alum & mag hydroxide-simeth, guaiFENesin-dextromethorphan, haloperidol lactate, hydrALAZINE, HYDROcodone-acetaminophen, ipratropium-albuterol, morphine injection, ondansetron (ZOFRAN) IV, ondansetron  Assessment/Plan:  Principal Problem:   Sepsis Active Problems:   HTN (hypertension)   Hypothyroidism   Dementia   Dehydration   Pneumonia   Hypernatremia   Lethargy   SIRS (systemic inflammatory response syndrome)    Sepsis possibly due to pneumonia.   One set of blood culture is positive for gram positive rods. Urine culture is negative. She remains stable.  Acute encephalopathy  Secondary to infection, dehydration and hypernatremia. Perhaps more lethargic last night per daughter and RN.  Aspiration versus Community acquired pneumonia Patient was started on vancomycin, Zosyn, Tamiflu. Influenza PCR negative. Tamiflu was stopped. Await final ID on blood culture. Continue current medications for now. Since she was febrile overnight and was more congested requiring suctioning will repeat CXR.  COPD with smoking history, and history of lung collapse  Scheduled Nebulizers. PRN oxygen. Mucolytics   Hypernatremia with severe dehydration and ARF and Hypokalemia Sodium now normal. She appears to be better hydrated. Continue IVF at lower rate. Seen by SLP and able to take orally now. Renal function is now normal. Await labs today. Potassium was repleted 1/17.  Hypothyroidism  Continue Synthroid.   Hypertension  Monitor BP. Continue current meds.   Patient on several psychotropics - possibly for dementia +/- agitation  Mental status has improved. Incremental initiation of her home medications.  Suspected failure to thrive/Moderate Protien Calorie Malnutrition Long-standing history of dementia, suspect very poor functional status at  baseline. PT/OT. Ensure. Daughter reports poor appetite. Consider Megace. Discussed with daughter patient's long term outlook considering dementia and slow decline.  Code Status: Full Code. Discussed again with daughter and she will think about this. She gave me an impression that patient would not have wanted heroic measures.  DVT Prophylaxis: Heparin    Family Communication: Discussed with daughter.  Disposition Plan: Due to fever cannot be discharged today. Daughter declines SNF. Will need Home health.    LOS: 3 days   [place] [facility] Pager [phone] 10/07/2013, 7:57 AM  If 8PM-8AM,  please contact night-coverage at [email or web address], password [facility]

## 2013-10-07 NOTE — Progress Notes (Addendum)
ANTIBIOTIC CONSULT NOTE - Follow up  Pharmacy Consult for zosyn and vancomycin Indication: pneumonia  Allergies  Allergen Reactions  . Sulfa Drugs Cross Reactors Other (See Comments)    unknown  . Contrast Media [Iodinated Diagnostic Agents] Rash    Patient Measurements: Height: 5' (152.4 cm) Weight: 104 lb 11.5 oz (47.5 kg) IBW/kg (Calculated) : 45.5 Adjusted Body Weight:   Vital Signs: Temp: 98 F (36.7 C) (01/18 0740) Temp src: Oral (01/18 0740) BP: 121/68 mmHg (01/18 0740) Pulse Rate: 79 (01/18 0740) Intake/Output from previous day: 01/17 0701 - 01/18 0700 In: 1601.3 [P.O.:500; I.V.:351.3; IV Piggyback:750] Out: -  Intake/Output from this shift: Total I/O In: 240 [P.O.:240] Out: -   Labs:  Recent Labs  10/05/13 0258 10/05/13 1340 10/06/13 0526 10/07/13 0703  WBC 14.9*  --  12.6* 10.8*  HGB 13.6  --  14.2 13.4  PLT 158  --  141* 137*  CREATININE 0.88 0.75 0.55 0.60   Estimated Creatinine Clearance: 34.2 ml/min (by C-G formula based on Cr of 0.6). No results found for this basename: VANCOTROUGH, Corlis Leak, VANCORANDOM, GENTTROUGH, GENTPEAK, GENTRANDOM, TOBRATROUGH, TOBRAPEAK, TOBRARND, AMIKACINPEAK, AMIKACINTROU, AMIKACIN,  in the last 72 hours   Microbiology: Recent Results (from the past 720 hour(s))  CULTURE, BLOOD (ROUTINE X 2)     Status: None   Collection Time    10/04/13 12:57 PM      Result Value Range Status   Specimen Description BLOOD LEFT FOREARM   Final   Special Requests BOTTLES DRAWN AEROBIC AND ANAEROBIC 10CC   Final   Culture  Setup Time     Final   Value: 10/04/2013 16:26     Performed at [facility]   Culture     Final   Value: DIPHTHEROIDS(CORYNEBACTERIUM SPECIES)     Note: Standardized susceptibility testing for this organism is not available.     Note: Gram Stain Report Called to,Read Back By and Verified With: Donato Heinz [id number] BRMEL     Performed at [facility]   Report Status 10/07/2013 FINAL    Final  URINE CULTURE     Status: None   Collection Time    10/04/13  1:28 PM      Result Value Range Status   Specimen Description URINE, CATHETERIZED   Final   Special Requests NONE   Final   Culture  Setup Time     Final   Value: 10/04/2013 14:42     Performed at [place]     Final   Value: NO GROWTH     Performed at [facility]   Culture     Final   Value: NO GROWTH     Performed at [facility]   Report Status 10/05/2013 FINAL   Final  CULTURE, BLOOD (ROUTINE X 2)     Status: None   Collection Time    10/04/13  1:40 PM      Result Value Range Status   Specimen Description BLOOD ARM LEFT   Final   Special Requests BOTTLES DRAWN AEROBIC ONLY 5CC   Final   Culture  Setup Time     Final   Value: 10/04/2013 16:26     Performed at [facility]   Culture     Final   Value:        BLOOD CULTURE RECEIVED NO GROWTH TO DATE CULTURE WILL BE HELD FOR 5 DAYS BEFORE ISSUING A FINAL NEGATIVE REPORT  Performed at [facility]   Report Status PENDING   Incomplete  RESPIRATORY VIRUS PANEL     Status: None   Collection Time    10/04/13  3:32 PM      Result Value Range Status   Source - RVPAN NASAL SWAB   Corrected   Comment: CORRECTED ON 01/16 AT 2216: PREVIOUSLY REPORTED AS NASAL SWAB   Respiratory Syncytial Virus A NOT DETECTED   Final   Respiratory Syncytial Virus B NOT DETECTED   Final   Influenza A NOT DETECTED   Final   Influenza B NOT DETECTED   Final   Parainfluenza 1 NOT DETECTED   Final   Parainfluenza 2 NOT DETECTED   Final   Parainfluenza 3 NOT DETECTED   Final   Metapneumovirus NOT DETECTED   Final   Rhinovirus NOT DETECTED   Final   Adenovirus NOT DETECTED   Final   Influenza A H1 NOT DETECTED   Final   Influenza A H3 NOT DETECTED   Final   Comment: (NOTE)           Normal Reference Range for each Analyte: NOT DETECTED     Testing performed using the Luminex xTAG Respiratory Viral Panel test     kit.      This test was developed and its performance characteristics determined     by [facility]. It has not been cleared or approved by the [place]     Food and Drug Administration. This test is used for clinical purposes.     It should not be regarded as investigational or for research. This     laboratory is certified under the [place] (CLIA) as qualified to perform high complexity     clinical laboratory testing.     Performed at [place] PCR SCREENING     Status: None   Collection Time    10/04/13  4:39 PM      Result Value Range Status   MRSA by PCR NEGATIVE  NEGATIVE Final   Comment:            The GeneXpert MRSA Assay (FDA     approved for NASAL specimens     only), is one component of a     comprehensive MRSA colonization     surveillance program. It is not     intended to diagnose MRSA     infection nor to guide or     monitor treatment for     MRSA infections.    Medical History: Past Medical History  Diagnosis Date  . Dementia   . Osteoarthritis   . Hypertension   . Thyroid disease   . Renal disorder     Medications:  Scheduled:  . collagenase   Topical Daily  . feeding supplement (ENSURE COMPLETE)  237 mL Oral TID BM  . guaiFENesin  200 mg Oral Q6H  . heparin  5,000 Units Subcutaneous Q8H  . levothyroxine  50 mcg Oral QHS  . megestrol  200 mg Oral Daily  . memantine  10 mg Oral BID  . metoprolol tartrate  12.5 mg Oral BID  . piperacillin-tazobactam (ZOSYN)  IV  3.375 g Intravenous Q8H  . risperiDONE  0.25 mg Oral BID  . sodium chloride  3 mL Intravenous Q12H  . vancomycin  1,000 mg Intravenous Q48H   Infusions:  . dextrose 5 % with KCl 20 mEq /  L 20 mEq (10/06/13 1326)   Assessment: 80 yo female with pneumonia started on vancomycin and zosyn. Patient is currently afebrile and wbc decreased to 10.8.  Patient was in acute renal failure; her SCr was up to 1.36, now Scr is back down to 0.60  (CrCl ~34).   Goal of Therapy:  Vancomycin trough level 15-20 mcg/ml  Plan:  1) Zosyn 3.375g iv q8h (4h infusion) 2) Vancomycin 1g iv q48h 3) Monitor WBC, fever, Scr, Crcl, Lytes 4) Consider VT when appropriate    Jeronimo Norma, PharmD Clinical Pharmacist Resident Pager: [phone]  Jeronimo Norma 10/07/2013,11:10 AM

## 2013-10-08 ENCOUNTER — Inpatient Hospital Stay (HOSPITAL_COMMUNITY): Payer: Medicare HMO

## 2013-10-08 DIAGNOSIS — E039 Hypothyroidism, unspecified: Secondary | ICD-10-CM

## 2013-10-08 LAB — CBC
HCT: 35.1 % — ABNORMAL LOW (ref 36.0–46.0)
Hemoglobin: 12.1 g/dL (ref 12.0–15.0)
MCH: 32.9 pg (ref 26.0–34.0)
MCHC: 34.5 g/dL (ref 30.0–36.0)
MCV: 95.4 fL (ref 78.0–100.0)
PLATELETS: 160 10*3/uL (ref 150–400)
RBC: 3.68 MIL/uL — ABNORMAL LOW (ref 3.87–5.11)
RDW: 12.8 % (ref 11.5–15.5)
WBC: 13.3 10*3/uL — AB (ref 4.0–10.5)

## 2013-10-08 LAB — BASIC METABOLIC PANEL
BUN: 14 mg/dL (ref 6–23)
CHLORIDE: 104 meq/L (ref 96–112)
CO2: 29 mEq/L (ref 19–32)
CREATININE: 0.63 mg/dL (ref 0.50–1.10)
Calcium: 8.7 mg/dL (ref 8.4–10.5)
GFR calc non Af Amer: 77 mL/min — ABNORMAL LOW (ref >90)
GFR, EST AFRICAN AMERICAN: 89 mL/min — AB (ref >90)
Glucose, Bld: 148 mg/dL — ABNORMAL HIGH (ref 70–99)
Potassium: 4 mEq/L (ref 3.7–5.3)
Sodium: 143 mEq/L (ref 137–147)

## 2013-10-08 LAB — GLUCOSE, CAPILLARY
GLUCOSE-CAPILLARY: 170 mg/dL — AB (ref 70–99)
Glucose-Capillary: 140 mg/dL — ABNORMAL HIGH (ref 70–99)
Glucose-Capillary: 104 mg/dL — ABNORMAL HIGH (ref 70–99)

## 2013-10-08 MED ORDER — AMOXICILLIN-POT CLAVULANATE 400-57 MG/5ML PO SUSR
800.0000 mg | Freq: Two times a day (BID) | ORAL | Status: DC
  Administered 2013-10-08 – 2013-10-10 (×5): 800 mg via ORAL
  Filled 2013-10-08 (×6): qty 10

## 2013-10-08 NOTE — Progress Notes (Addendum)
[facility] PROGRESS NOTE  [name] Davidson NHA:[id number] DOB: [birth year] DOA: 10/04/2013  PCP: Woody Seller, MD  Brief HPI: [name] Davidson is a 80 y.o. female, with a past medical history of dementia hypertension, thyroid disease, right middle lung lobe collapse, sacral decubitus ulcer, and dehydration who presented to the emergency department with lethargy and altered mental status. The patient lives at home with her daughter. She is primarily bedridden but able to take some steps with assistance. In the emergency department she was found to be febrile with a temp of 101, tachycardic, and tachypnea. Her sodium level is 160. She appeared severely dehydrated.  Past medical history:  Past Medical History  Diagnosis Date  . Dementia   . Osteoarthritis   . Hypertension   . Thyroid disease   . Renal disorder     Consultants: None  Procedures: None  Antibiotics: Vanc 1/15-->1/19 Zosyn 1/15-->1/19 Tamiflu 1/15-->1/16 Augmentin 1/19-->  Subjective: Patient alert but confused. Denies any pain. Denies any complaints.   Objective: Vital Signs  Filed Vitals:   10/07/13 2226 10/08/13 0015 10/08/13 0614 10/08/13 0945  BP: 143/83  109/54 107/50  Pulse: 92  83 75  Temp: 98.8 F (37.1 C)  99 F (37.2 C) 97.4 F (36.3 C)  TempSrc: Axillary  Axillary Oral  Resp: 18  18 18   Height:      Weight:      SpO2: 97% 100% 96% 98%    Intake/Output Summary (Last 24 hours) at 10/08/13 1148 Last data filed at 10/08/13 0700  Gross per 24 hour  Intake   1590 ml  Output      0 ml  Net   1590 ml   Filed Weights   10/04/13 1700 10/06/13 0652 10/06/13 2242  Weight: 44 kg (97 lb) 47.3 kg (104 lb 4.4 oz) 47.5 kg (104 lb 11.5 oz)    General appearance: alert, cooperative, appears stated age and no distress Resp: Coarse breath sounds bilaterally with crackles at bases. No wheezing. Improved air entry overall. Cardio: regular rate and rhythm, S1, S2 normal, no murmur, click, rub or  gallop GI: soft, non-tender; bowel sounds normal; no masses,  no organomegaly Extremities: extremities normal, atraumatic, no cyanosis or edema Neurologic: Alert. Disoriented. No focal deficits.  Lab Results:  Basic Metabolic Panel:  Recent Labs Lab 10/05/13 0258 10/05/13 1340 10/06/13 0526 10/07/13 0703 10/08/13 0445  NA 157* 153* 141 141 143  K 3.5* 3.3* 3.2* 3.9 4.0  CL 121* 115* 107 105 104  CO2 22 21 21 23 29   GLUCOSE 125* 150* 124* 100* 148*  BUN 47* 36* 17 11 14   CREATININE 0.88 0.75 0.55 0.60 0.63  CALCIUM 8.0* 7.9* 7.8* 8.3* 8.7   Liver Function Tests:  Recent Labs Lab 10/04/13 1255  AST 57*  ALT 24  ALKPHOS 43  BILITOT 0.5  PROT 7.0  ALBUMIN 3.4*   CBC:  Recent Labs Lab 10/04/13 1255 10/05/13 0258 10/06/13 0526 10/07/13 0703 10/08/13 0445  WBC 15.7* 14.9* 12.6* 10.8* 13.3*  NEUTROABS 13.4*  --   --   --   --   HGB 16.6* 13.6 14.2 13.4 12.1  HCT 50.0* 42.0 41.7 38.9 35.1*  MCV 101.0* 101.2* 97.4 95.1 95.4  PLT 212 158 141* 137* 160   Cardiac Enzymes:  Recent Labs Lab 10/04/13 1820  TROPONINI <0.30   CBG:  Recent Labs Lab 10/07/13 0800 10/07/13 1145 10/07/13 1605 10/08/13 10/08/13 0611  GLUCAP 115* 150* 131* 170* 140*    Recent Results (from  the past 240 hour(s))  CULTURE, BLOOD (ROUTINE X 2)     Status: None   Collection Time    10/04/13 12:57 PM      Result Value Range Status   Specimen Description BLOOD LEFT FOREARM   Final   Special Requests BOTTLES DRAWN AEROBIC AND ANAEROBIC 10CC   Final   Culture  Setup Time     Final   Value: 10/04/2013 16:26     Performed at [facility]   Culture     Final   Value: DIPHTHEROIDS(CORYNEBACTERIUM SPECIES)     Note: Standardized susceptibility testing for this organism is not available.     Note: Gram Stain Report Called to,Read Back By and Verified With: Donato Heinz [id number] BRMEL     Performed at [facility]   Report Status 10/07/2013 FINAL   Final  URINE  CULTURE     Status: None   Collection Time    10/04/13  1:28 PM      Result Value Range Status   Specimen Description URINE, CATHETERIZED   Final   Special Requests NONE   Final   Culture  Setup Time     Final   Value: 10/04/2013 14:42     Performed at [place]     Final   Value: NO GROWTH     Performed at [facility]   Culture     Final   Value: NO GROWTH     Performed at [facility]   Report Status 10/05/2013 FINAL   Final  CULTURE, BLOOD (ROUTINE X 2)     Status: None   Collection Time    10/04/13  1:40 PM      Result Value Range Status   Specimen Description BLOOD ARM LEFT   Final   Special Requests BOTTLES DRAWN AEROBIC ONLY 5CC   Final   Culture  Setup Time     Final   Value: 10/04/2013 16:26     Performed at [facility]   Culture     Final   Value:        BLOOD CULTURE RECEIVED NO GROWTH TO DATE CULTURE WILL BE HELD FOR 5 DAYS BEFORE ISSUING A FINAL NEGATIVE REPORT     Performed at [facility]   Report Status PENDING   Incomplete  RESPIRATORY VIRUS PANEL     Status: None   Collection Time    10/04/13  3:32 PM      Result Value Range Status   Source - RVPAN NASAL SWAB   Corrected   Comment: CORRECTED ON 01/16 AT 2216: PREVIOUSLY REPORTED AS NASAL SWAB   Respiratory Syncytial Virus A NOT DETECTED   Final   Respiratory Syncytial Virus B NOT DETECTED   Final   Influenza A NOT DETECTED   Final   Influenza B NOT DETECTED   Final   Parainfluenza 1 NOT DETECTED   Final   Parainfluenza 2 NOT DETECTED   Final   Parainfluenza 3 NOT DETECTED   Final   Metapneumovirus NOT DETECTED   Final   Rhinovirus NOT DETECTED   Final   Adenovirus NOT DETECTED   Final   Influenza A H1 NOT DETECTED   Final   Influenza A H3 NOT DETECTED   Final   Comment: (NOTE)           Normal Reference Range for each Analyte: NOT DETECTED     Testing performed  using the Luminex xTAG Respiratory Viral Panel test     kit.     This test was  developed and its performance characteristics determined     by [facility]. It has not been cleared or approved by the [place]     Food and Drug Administration. This test is used for clinical purposes.     It should not be regarded as investigational or for research. This     laboratory is certified under the [place] (CLIA) as qualified to perform high complexity     clinical laboratory testing.     Performed at [place] PCR SCREENING     Status: None   Collection Time    10/04/13  4:39 PM      Result Value Range Status   MRSA by PCR NEGATIVE  NEGATIVE Final   Comment:            The GeneXpert MRSA Assay (FDA     approved for NASAL specimens     only), is one component of a     comprehensive MRSA colonization     surveillance program. It is not     intended to diagnose MRSA     infection nor to guide or     monitor treatment for     MRSA infections.      Studies/Results: Dg Chest Port 1 View  10/07/2013   CLINICAL DATA:  Cough  EXAM: PORTABLE CHEST - 1 VIEW  COMPARISON:  10/05/2013  FINDINGS: Heart size and vascular pattern are normal. Right lung is clear. There is mild hazy opacity at the left base which was not present previously.  IMPRESSION: Development of mild opacity at the left base which could represent atelectasis or pneumonia.   Electronically Signed   By: Skipper Cliche M.D.   On: 10/07/2013 09:19    Medications:  Scheduled: . collagenase   Topical Daily  . feeding supplement (ENSURE COMPLETE)  237 mL Oral TID BM  . guaiFENesin  200 mg Oral Q6H  . heparin  5,000 Units Subcutaneous Q8H  . levothyroxine  50 mcg Oral QHS  . megestrol  200 mg Oral Daily  . memantine  10 mg Oral BID  . metoprolol tartrate  12.5 mg Oral BID  . piperacillin-tazobactam (ZOSYN)  IV  3.375 g Intravenous Q8H  . risperiDONE  0.25 mg Oral BID  . sodium chloride  3 mL Intravenous Q12H  . vancomycin  1,000 mg Intravenous  Q48H   Continuous: . dextrose 5 % with KCl 20 mEq / L 20 mEq (10/07/13 1709)   SVX:[id number], acetaminophen, alum & mag hydroxide-simeth, guaiFENesin-dextromethorphan, haloperidol lactate, hydrALAZINE, HYDROcodone-acetaminophen, ipratropium-albuterol, morphine injection, ondansetron (ZOFRAN) IV, ondansetron  Assessment/Plan:  Principal Problem:   Sepsis Active Problems:   HTN (hypertension)   Hypothyroidism   Dementia   Dehydration   Pneumonia   Hypernatremia   Lethargy   SIRS (systemic inflammatory response syndrome)    Sepsis possibly due to pneumonia.  Resolved. One set of blood culture is positive for Diphtheroids which is a contaminant. Urine culture is negative. She remains stable.  Acute encephalopathy  Secondary to infection, dehydration and hypernatremia. Perhaps more lethargic last night per daughter and RN.  Aspiration versus Community acquired pneumonia Patient was started on vancomycin, Zosyn, Tamiflu. Influenza PCR negative. Tamiflu was stopped. Change to Augmentin today. No further fever overnight. CXR repeated yesterday showed possible atelectasis versus infiltrate at left  base. IS as much as tolerated. MBS ordered for today and is pending.  COPD with smoking history, and history of lung collapse  Scheduled Nebulizers. PRN oxygen. Mucolytics   Hypernatremia with severe dehydration and ARF and Hypokalemia Sodium now normal. She appears to be better hydrated. Seen by SLP and able to take orally now. Renal function is now normal.   Hypothyroidism  Continue Synthroid.   Hypertension  Monitor BP. Continue current meds.   Patient on several psychotropics - possibly for dementia +/- agitation  Mental status has improved. Incremental initiation of her home medications. Likely has some aspiration as a result.  Suspected failure to thrive/Severe Protien Calorie Malnutrition Long-standing history of dementia, suspect very poor functional status at baseline.  PT/OT. Ensure. Daughter reports poor appetite. Started Megace. Discussed with daughter patient's long term outlook considering dementia and slow decline.  Code Status: Full Code. Discussed again yesterday with daughter and she will think about this. She gave me an impression that patient would not have wanted heroic measures. Will discuss again today after MBS results are available. DVT Prophylaxis: Heparin    Family Communication: Discussed with daughter.  Disposition Plan: Anticipate discharge 1/20 if remains afebrile. Daughter has declined SNF. Will need HH.    LOS: 4 days   [facility] Pager [phone] 10/08/2013, 11:48 AM  If 8PM-8AM, please contact night-coverage at [email or web address], password [email or web address]   Discussed MBS results with daughter. Told her of aspiration risk. She spoke to her brother and they have decided to make patient DNR. They will still want all treatments short of resuscitation and life support. She agrees with plan for discharge 1/20.  Laurence Folz 2:42 PM

## 2013-10-08 NOTE — Procedures (Signed)
Objective Swallowing Evaluation: Modified Barium Swallowing Study  Patient Details  Name: [name] Davidson MRN: [id number] Date of Birth: October 30, 1921  Today's Date: 10/08/2013 Time: [id number] SLP Time Calculation (min): 32 min  Past Medical History:  Past Medical History  Diagnosis Date  . Dementia   . Osteoarthritis   . Hypertension   . Thyroid disease   . Renal disorder    Past Surgical History:  Past Surgical History  Procedure Laterality Date  . Cyst removal neck    . Total abdominal hysterectomy    . Cholecystectomy     HPI:  80 year old white female presents emergency department via EMS with chief complaint of altered mental status. History obtained from the patient's daughter who is her primary caregiver. Per her daughter the patient is prone to urinary tract infections. She reports the patient has been lethargic and altered for 2 daysPatient has a past medical history of dementia, osteoarthritis, hypertension, thyroid disease, renal disorder, multiple UTIs in the past. She is bedridden for the most part but is able to ambulate minimally with assistance and walker at baseline. Dx include Sepsis - uncertain etiology: possibly influenza versus aspiration pneumonia; acute encephalopathy; hypernatremia with severe dehydration      Assessment / Plan / Recommendation Clinical Impression  Dysphagia Diagnosis: Mild cervical esophageal phase dysphagia;Mild pharyngeal phase dysphagia;Mild oral phase dysphagia Clinical impression: Pt demonstrates a mild to moderate pharyngeal dysphagia limited primarily by poor head support leading to vulnerabilty of airway before and during the swallow. Due to anterior/left leaning posture, pts open airway collects thin and nectar thick liquids before and during the swallow (as the valleculae typically does). The strength of the pts swallow is actually very good, expelling the majority of penetrate and transiting most of bolus. A cued throat clear helps pt  expel penetrate.   Overall, pt is at moderate aspiration risk with probable small aspiration events during meals without sensation. Pt should be frequently respositioned during the meal for best head control, offered small staff controlled straw sips (pt drinks too fast), and encouraged to clear her throat frequently. Dys 3 (mech soft) diet with thin liquids is recommended.     Treatment Recommendation  Therapy as outlined in treatment plan below    Diet Recommendation Dysphagia 3 (Mechanical Soft);Thin liquid   Liquid Administration via: Cup;Straw Medication Administration: Whole meds with puree Supervision: Staff to assist with self feeding Compensations: Slow rate;Small sips/bites;Clear throat intermittently Postural Changes and/or Swallow Maneuvers: Seated upright 90 degrees    Other  Recommendations Oral Care Recommendations: Oral care BID   Follow Up Recommendations  Skilled Nursing facility    Frequency and Duration min 2x/week  2 weeks   Pertinent Vitals/Pain NA    SLP Swallow Goals     General HPI: 80 year old white female presents emergency department via EMS with chief complaint of altered mental status. History obtained from the patient's daughter who is her primary caregiver. Per her daughter the patient is prone to urinary tract infections. She reports the patient has been lethargic and altered for 2 daysPatient has a past medical history of dementia, osteoarthritis, hypertension, thyroid disease, renal disorder, multiple UTIs in the past. She is bedridden for the most part but is able to ambulate minimally with assistance and walker at baseline. Dx include Sepsis - uncertain etiology: possibly influenza versus aspiration pneumonia; acute encephalopathy; hypernatremia with severe dehydration  Type of Study: Modified Barium Swallowing Study Reason for Referral: Objectively evaluate swallowing function Previous Swallow Assessment: none per records Diet Prior to  this  Study: Dysphagia 3 (soft);Thin liquids Temperature Spikes Noted: No Respiratory Status: Nasal cannula History of Recent Intubation: No Behavior/Cognition: Alert;Cooperative Oral Cavity - Dentition: Missing dentition Oral Motor / Sensory Function: Within functional limits Self-Feeding Abilities: Needs assist Patient Positioning: Postural control interferes with function Baseline Vocal Quality: Clear Volitional Cough: Weak Volitional Swallow: Unable to elicit Anatomy: Within functional limits    Reason for Referral Objectively evaluate swallowing function   Oral Phase Oral Preparation/Oral Phase Oral Phase: Impaired Oral - Nectar Oral - Nectar Cup: Reduced posterior propulsion;Lingual/palatal residue;Delayed oral transit Oral - Thin Oral - Thin Cup: Reduced posterior propulsion;Lingual/palatal residue;Delayed oral transit Oral - Thin Straw: Reduced posterior propulsion;Lingual/palatal residue;Delayed oral transit Oral - Solids Oral - Puree: Reduced posterior propulsion;Lingual/palatal residue;Delayed oral transit Oral - Mechanical Soft: Reduced posterior propulsion;Lingual/palatal residue;Delayed oral transit Oral - Pill: Reduced posterior propulsion;Lingual/palatal residue;Delayed oral transit;Holding of bolus;Pocketing in anterior sulcus (did not transit pill)   Pharyngeal Phase Pharyngeal Phase Pharyngeal Phase: Impaired Pharyngeal - Nectar Pharyngeal - Nectar Cup: Delayed swallow initiation;Premature spillage to pyriform sinuses;Penetration/Aspiration before swallow;Trace aspiration;Pharyngeal residue - cp segment;Pharyngeal residue - pyriform sinuses;Compensatory strategies attempted (Comment) (throat clear) Penetration/Aspiration details (nectar cup): Material enters airway, CONTACTS cords and not ejected out Pharyngeal - Thin Pharyngeal - Thin Cup: Delayed swallow initiation;Premature spillage to pyriform sinuses;Penetration/Aspiration before swallow;Trace aspiration;Pharyngeal  residue - cp segment;Pharyngeal residue - pyriform sinuses;Compensatory strategies attempted (Comment) Penetration/Aspiration details (thin cup): Material enters airway, CONTACTS cords and not ejected out Pharyngeal - Thin Straw: Delayed swallow initiation;Premature spillage to pyriform sinuses;Penetration/Aspiration before swallow;Trace aspiration;Pharyngeal residue - cp segment;Pharyngeal residue - pyriform sinuses;Compensatory strategies attempted (Comment) Penetration/Aspiration details (thin straw): Material enters airway, CONTACTS cords and not ejected out Pharyngeal - Solids Pharyngeal - Puree: Delayed swallow initiation;Pharyngeal residue - cp segment;Pharyngeal residue - pyriform sinuses;Compensatory strategies attempted (Comment) Penetration/Aspiration details (puree): Material does not enter airway Pharyngeal - Mechanical Soft: Delayed swallow initiation;Pharyngeal residue - cp segment;Pharyngeal residue - pyriform sinuses;Compensatory strategies attempted (Comment) Pharyngeal - Pill: Not tested  Cervical Esophageal Phase    GO    Cervical Esophageal Phase Cervical Esophageal Phase: Impaired        Harlon Ditty, MA CCC-SLP [phone]  Claudine Mouton 10/08/2013, 1:44 PM

## 2013-10-08 NOTE — Progress Notes (Signed)
Physical Therapy Treatment Patient Details Name: [name] Davidson MRN: [id number] DOB: May 13, 1922 Today's Date: 10/08/2013 Time: [id number] PT Time Calculation (min): 20 min  PT Assessment / Plan / Recommendation  History of Present Illness [name] Davidson is a 80 y.o. female, with a past medical history of dementia hypertension, thyroid disease, right middle lung lobe collapse, sacral decubitus ulcer, and dehydration who presented to the emergency department with lethargy and altered mental status. The patient lives at home with her daughter. She is primarily bedridden but able to take some steps with assistance. In the emergency department she was found to be febrile with a temp of 101, tachycardic, and tachypnea. Her sodium level is 160. She appeared severely dehydrated.   PT Comments   Pt progressing in that she was able to transfer bed to chair however, she currently requires +2 assist for OOB mobility and may need lift for family to take her home safely. Pt with left neck pain with significant neck fwd and lateral flexion. Heat applied after session. PT will continue to follow.   Follow Up Recommendations  Home health PT;Supervision/Assistance - 24 hour     Does the patient have the potential to tolerate intense rehabilitation     Barriers to Discharge        Equipment Recommendations  Wheelchair (measurements PT);Wheelchair cushion (measurements PT);Other (comment) (lift for OOB)    Recommendations for Other Services OT consult  Frequency Min 3X/week   Progress towards PT Goals Progress towards PT goals: Progressing toward goals  Plan Current plan remains appropriate    Precautions / Restrictions Precautions Precautions: Fall Restrictions Weight Bearing Restrictions: No   Pertinent Vitals/Pain Faces 6/10 left neck pain, heat applied    Mobility  Bed Mobility Overal bed mobility: Needs Assistance Bed Mobility: Rolling;Supine to Sit Rolling: Mod assist Supine to sit: +2  for physical assistance General bed mobility comments: pt was able to assist in sliding legs fwd with rolling, needed +2 tot A for SL to sit, performed 10% of effort and c/o neck pain Transfers Overall transfer level: Needs assistance Equipment used: 2 person hand held assist Transfers: Squat Pivot Transfers Squat pivot transfers: +2 physical assistance General transfer comment: required +2 assist for power and safety, knees and feet blocked, full upright position not achieved. First attempted standing with assist on each side of pt and this was not enough support for her. Squat pivot was performed with assist from front and back. Pt performed 20%, flexed posture without full extension of knees or trunk Ambulation/Gait General Gait Details: unable    Exercises General Exercises - Lower Extremity Ankle Circles/Pumps: AROM;Both;10 reps;Seated   PT Diagnosis:    PT Problem List:   PT Treatment Interventions:     PT Goals (current goals can now be found in the care plan section) Acute Rehab PT Goals Patient Stated Goal: none stated PT Goal Formulation: With patient/family Time For Goal Achievement: 10/19/13 Potential to Achieve Goals: Fair  Visit Information  Last PT Received On: 10/08/13 Assistance Needed: +2 History of Present Illness: [name] Davidson is a 80 y.o. female, with a past medical history of dementia hypertension, thyroid disease, right middle lung lobe collapse, sacral decubitus ulcer, and dehydration who presented to the emergency department with lethargy and altered mental status. The patient lives at home with her daughter. She is primarily bedridden but able to take some steps with assistance. In the emergency department she was found to be febrile with a temp of 101, tachycardic, and tachypnea. Her sodium  level is 160. She appeared severely dehydrated.    Subjective Data  Subjective: pt reports that she has been on the go all day and is worn out Patient Stated Goal:  none stated   Cognition  Cognition Arousal/Alertness: Awake/alert Behavior During Therapy: WFL for tasks assessed/performed Overall Cognitive Status: History of cognitive impairments - at baseline Memory: Decreased short-term memory    Balance  Balance Overall balance assessment: Needs assistance Sitting-balance support: Feet unsupported;Single extremity supported Sitting balance-Leahy Scale: Poor Sitting balance - Comments: strong left lean, tolerated sitting 10 mins Postural control: Left lateral lean General Comments General comments (skin integrity, edema, etc.): pt with neck in  left lateral and fwd flexed position, stretching given to right lateral flexors and massage to left lateral neck. Pt point tender at left neck. Expect this is permorbid but maybe worsening. Positioned in chair with pillows to support neck as well as heat to left neck   End of Session PT - End of Session Equipment Utilized During Treatment: Gait belt;Oxygen Activity Tolerance: Patient limited by fatigue;Patient limited by pain Patient left: in chair;with call bell/phone within reach Nurse Communication: Mobility status;Other (comment) (pill found on pt floor)   GP   Lyanne Co, PT  Acute Rehab Services  [phone]   Lyanne Co 10/08/2013, 2:27 PM

## 2013-10-08 NOTE — Progress Notes (Signed)
OT Cancellation Note and Discharge  Patient Details Name: Ozella Rockslizabeth Hoh MRN: [id number] DOB: 1922/01/29   Cancelled Treatment:    Reason Eval/Treat Not Completed: OT screened, no needs identified, will sign off. Per Pt note and MD notes, pt basically bedridden with only taking a few steps. Pt was total A for all BADL.s. No acute OT needs identified, will sign off.  Evette GeorgesLeonard, Keisa Blow Eva [phone] 10/08/2013, 2:25 PM

## 2013-10-08 NOTE — Care Management Note (Signed)
   CARE MANAGEMENT NOTE 10/08/2013  [facility]   Account Number:  [id number]  Date Initiated:  10/05/2013  Documentation initiated by:  Donn PieriniWEBSTER,KRISTI  Subjective/Objective Assessment:   Pt admitted with sepsis     Action/Plan:   PTA pt lived at home with daughter and family  10/08/13 Spoke with pt [facility]   Anticipated DC Date:  10/08/2013   Anticipated DC Plan:  HOME W HOME HEALTH SERVICES      DC Planning Services  CM consult      Choice offered to / List presented to:          [facility]  [facility]  [facility] PT      [facility]  [facility]   Status of service:  Completed, signed off Medicare Important Message given?   (If response is "NO", the following Medicare IM given date fields will be blank) Date Medicare IM given:   Date Additional Medicare IM given:    Discharge Disposition:  HOME W HOME HEALTH SERVICES  Per UR Regulation:  Reviewed for med. necessity/level of care/duration of stay  If discussed at Long Length of Stay Meetings, dates discussed:    Comments:   10/08/2013 Spoke with pt daughter, Mrs Archie PattenMonroe and discussed HH needs, [facility]. Daughter states that she has DME would like another wheelchair but has had the currentl one less than 5 years so insurance most likely would not cover another wheelchair at this time. [facility] notified of HH needs. Johny Shockheryl Fredy Gladu RN MPH, case manager, [phone]  10/05/13- 1500- Donn PieriniKristi Webster RN, BSN [phone] Referral received for home needs- spoke wtih pt's daughter Dois DavenportSandra at bedside- per conversation- daughter states that she cares for  other family members other than her mother that are disabled- including her spouse. Unforturnatley insurance/Medicare does not cover "assistance" in the home to help care family- explained this to daughter who understands that she would have to pay out of pocket for assistance - she is  interested in list of agencies that provide services- list of private duty care agencies given to her for reference. Discussed if [facility] for pt might would be an option but she stated that pt had been in one in past and did not like it- she feels like pt would not agree to go. She has needed equipment at home- to include RW, W/C, BSC, and tub bench. NCM to f/u on PT/OT eval recommendations.

## 2013-10-08 NOTE — Clinical Documentation Improvement (Signed)
Possible Clinical Conditions?  Severe Malnutrition   Protein Calorie Malnutrition Severe Protein Calorie Malnutrition Other Condition Cannot clinically determine  Supporting Information: NUTRITION ASSESSMENT by Haynes BastSamantha J Worley, RD at 10/05/2013  9:53 AM  Risk Factors:PMHx significant for dementia, osteoarthritis, HTN, thyroid disease, renal disorder. Pt is from home with daughter; primarily bedridden. Admitted with AMS x 2 hr and febrile. Work-up reveals CAP, severe dehydration. Influenza panel pending.  Signs & Symptoms:Pt meets criteria for severe MALNUTRITION in the context of chronic illness as evidenced by severe fat and muscle mass loss. Also, suspect intake of <75% x at least 1 month.  Diagnostics: BMI=18.94 kg/(m^2).  DOCUMENTATION CODES  Per approved criteria  -Severe malnutrition in the context of chronic illness   Treatment:INTERVENTION: Once diet order permits, recommend Ensure Complete po BID, each supplement provides 350 kcal and 13 grams of protein. Monitor magnesium, potassium, and phosphorus daily for at least 3 days, MD to replete as needed, as pt is at risk for refeeding syndrome given severe malnutrition. RD to continue to follow nutrition care plan.  Thank You, Nevin BloodgoodJoan B Christpoher Sievers, RN, BSN, CCDS, Clinical Documentation Specialist:  [phone]  [facility] Information Management

## 2013-10-09 LAB — GLUCOSE, CAPILLARY
Glucose-Capillary: 120 mg/dL — ABNORMAL HIGH (ref 70–99)
Glucose-Capillary: 77 mg/dL (ref 70–99)
Glucose-Capillary: 84 mg/dL (ref 70–99)
Glucose-Capillary: 87 mg/dL (ref 70–99)

## 2013-10-09 MED ORDER — AMOXICILLIN-POT CLAVULANATE 400-57 MG/5ML PO SUSR: 800.0000 mg | Freq: Two times a day (BID) | ORAL | Status: AC

## 2013-10-09 MED ORDER — ENSURE COMPLETE PO LIQD: 237.0000 mL | Freq: Three times a day (TID) | ORAL | Status: AC

## 2013-10-09 MED ORDER — RISPERIDONE 0.25 MG PO TABS: 0.2500 mg | ORAL_TABLET | Freq: Two times a day (BID) | ORAL | Status: AC

## 2013-10-09 MED ORDER — MEGESTROL ACETATE 40 MG/ML PO SUSP: 200.0000 mg | Freq: Every day | ORAL | Status: AC

## 2013-10-09 NOTE — Progress Notes (Signed)
Patient's daughter, Dois DavenportSandra called this am prior to MD rounding on patient and inquired if she was being d/c'd today. Explained to daughter that MD had not written a d/c order but was contemplating her being d/c'd. Daughter called again later and asked if patient could be transported via ambulance? SW requested to look into it. Daughter called at [phone].[phone] and told that ambulance would be transporting patient home. Daughter became upset stating she could not take patient, that it was too late, that her room was not ready. When asked what needed to be done,she answered, "it's just not ready, it's too late, I can't take her back now, I have a disabled husband and daughter,too." Explained that if patient not transported tonight she would be charged for an extra night since she was medically ready for d/c (per Dr.Krishnan) and that she may have to pay out of pocket. Daughter stated she understood and agreed. Explained that patient would be coming home in the am.

## 2013-10-09 NOTE — Discharge Summary (Signed)
[facility]  Physician Discharge Summary   Patient ID: [name] Davidson MRN: [id number] DOB/AGE: Jan 22, 1922 80 y.o.  Admit date: 10/04/2013 Discharge date: 10/09/2013  PCP: Woody Seller, MD  DISCHARGE DIAGNOSES:  Active Problems:   HTN (hypertension)   Hypothyroidism   Dementia   Pneumonia   RECOMMENDATIONS FOR OUTPATIENT FOLLOW UP: 1. Please continue to have end of life discussions with family esp artificial nutrition in future. 2.   If appetite doesn't improve with Megace please consider stopping it.  DISCHARGE CONDITION: fair  Diet recommendation: Dysphagia 3 diet  Filed Weights   10/06/13 4037 10/06/13 2242 10/08/13 2217  Weight: 47.3 kg (104 lb 4.4 oz) 47.5 kg (104 lb 11.5 oz) 46.131 kg (101 lb 11.2 oz)    INITIAL HISTORY: [name] Davidson is a 80 y.o. female, with a past medical history of dementia hypertension, thyroid disease, right middle lung lobe collapse, sacral decubitus ulcer, and dehydration who presented to the emergency department with lethargy and altered mental status. The patient lives at home with her daughter. She is primarily bedridden but able to take some steps with assistance. In the emergency department she was found to be febrile with a temp of 101, tachycardic, and tachypnea. Her sodium level is 160. She appeared severely dehydrated.  Consultations:  None  Procedures:  None  HOSPITAL COURSE:   Sepsis possibly due to pneumonia.  This has resolved. One set of blood culture is positive for Diphtheroids which is a contaminant. Urine culture is negative. She remains afebrile.   Acute encephalopathy  This was secondary to infection, dehydration and hypernatremia. She seems to be back to her baseline.   Aspiration versus Community acquired pneumonia  Patient was started on vancomycin, Zosyn, Tamiflu. Influenza PCR negative. Tamiflu was stopped. She was changed to Augmentin and this will be continued. No further fever. IS at home as  tolerated.   Dysphagia MBS was done and patient was seen by SLP. She have risk for aspiration due to mild to moderate pharyngeal dysphagia. Daughter was informed. Continue Dys 3 diet with thin liquids.   History of COPD with smoking history, and history of lung collapse  This was stable. Continue home medications.   Hypernatremia with severe dehydration and ARF and Hypokalemia  She was given IVF and her sodium has improved. She should remain adequately hydrated at home. Renal function is now normal.   Hypothyroidism  Continue Synthroid.   Hypertension  BP stable with BB. Will stop Amlodipine to avoid hypotension. Can be monitored at home.   Patient on several psychotropics - possibly for dementia +/- agitation  Mental status has improved. Incremental initiation of her home medications. Dose of risperdal decreased.  Suspected failure to thrive/Severe Protien Calorie Malnutrition  Long-standing history of dementia, suspect very poor functional status at baseline. Ensure might help nutritional status. Daughter reported poor appetite and so we started Megace. Discussed with daughter patient's long term outlook considering dementia and slow decline. She understands. Patient is now DNR. But it might be useful to continue this discussion with PCP especially with regards to artificial nutrition which will be an issue in future.  Code Status: DNR  Patient has improved. She has poor functioning at baseline. Daughter wants to take her home. She is stable for discharge.   PERTINENT LABS:  The results of significant diagnostics from this hospitalization (including imaging, microbiology, ancillary and laboratory) are listed below for reference.    Microbiology: Recent Results (from the past 240 hour(s))  CULTURE, BLOOD (ROUTINE X 2)  Status: None   Collection Time    10/04/13 12:57 PM      Result Value Range Status   Specimen Description BLOOD LEFT FOREARM   Final   Special Requests  BOTTLES DRAWN AEROBIC AND ANAEROBIC 10CC   Final   Culture  Setup Time     Final   Value: 10/04/2013 16:26     Performed at [facility]   Culture     Final   Value: DIPHTHEROIDS(CORYNEBACTERIUM SPECIES)     Note: Standardized susceptibility testing for this organism is not available.     Note: Gram Stain Report Called to,Read Back By and Verified With: Donato Heinz [id number] BRMEL     Performed at [facility]   Report Status 10/07/2013 FINAL   Final  URINE CULTURE     Status: None   Collection Time    10/04/13  1:28 PM      Result Value Range Status   Specimen Description URINE, CATHETERIZED   Final   Special Requests NONE   Final   Culture  Setup Time     Final   Value: 10/04/2013 14:42     Performed at [facility]     Final   Value: NO GROWTH     Performed at [facility]   Culture     Final   Value: NO GROWTH     Performed at [facility]   Report Status 10/05/2013 FINAL   Final  CULTURE, BLOOD (ROUTINE X 2)     Status: None   Collection Time    10/04/13  1:40 PM      Result Value Range Status   Specimen Description BLOOD ARM LEFT   Final   Special Requests BOTTLES DRAWN AEROBIC ONLY 5CC   Final   Culture  Setup Time     Final   Value: 10/04/2013 16:26     Performed at [facility]   Culture     Final   Value:        BLOOD CULTURE RECEIVED NO GROWTH TO DATE CULTURE WILL BE HELD FOR 5 DAYS BEFORE ISSUING A FINAL NEGATIVE REPORT     Performed at [facility]   Report Status PENDING   Incomplete  RESPIRATORY VIRUS PANEL     Status: None   Collection Time    10/04/13  3:32 PM      Result Value Range Status   Source - RVPAN NASAL SWAB   Corrected   Comment: CORRECTED ON 01/16 AT 2216: PREVIOUSLY REPORTED AS NASAL SWAB   Respiratory Syncytial Virus A NOT DETECTED   Final   Respiratory Syncytial Virus B NOT DETECTED   Final   Influenza A NOT DETECTED   Final   Influenza B NOT DETECTED   Final     Parainfluenza 1 NOT DETECTED   Final   Parainfluenza 2 NOT DETECTED   Final   Parainfluenza 3 NOT DETECTED   Final   Metapneumovirus NOT DETECTED   Final   Rhinovirus NOT DETECTED   Final   Adenovirus NOT DETECTED   Final   Influenza A H1 NOT DETECTED   Final   Influenza A H3 NOT DETECTED   Final   Comment: (NOTE)           Normal Reference Range for each Analyte: NOT DETECTED     Testing performed using the Luminex xTAG Respiratory Viral Panel test  kit.     This test was developed and its performance characteristics determined     by [facility]. It has not been cleared or approved by the [place]     Food and Drug Administration. This test is used for clinical purposes.     It should not be regarded as investigational or for research. This     laboratory is certified under the [place] (CLIA) as qualified to perform high complexity     clinical laboratory testing.     Performed at [facility] PCR SCREENING     Status: None   Collection Time    10/04/13  4:39 PM      Result Value Range Status   MRSA by PCR NEGATIVE  NEGATIVE Final   Comment:            The GeneXpert MRSA Assay (FDA     approved for NASAL specimens     only), is one component of a     comprehensive MRSA colonization     surveillance program. It is not     intended to diagnose MRSA     infection nor to guide or     monitor treatment for     MRSA infections.     Labs: Basic Metabolic Panel:  Recent Labs Lab 10/05/13 0258 10/05/13 1340 10/06/13 0526 10/07/13 0703 10/08/13 0445  NA 157* 153* 141 141 143  K 3.5* 3.3* 3.2* 3.9 4.0  CL 121* 115* 107 105 104  CO2 22 21 21 23 29   GLUCOSE 125* 150* 124* 100* 148*  BUN 47* 36* 17 11 14   CREATININE 0.88 0.75 0.55 0.60 0.63  CALCIUM 8.0* 7.9* 7.8* 8.3* 8.7   Liver Function Tests:  Recent Labs Lab 10/04/13 1255  AST 57*  ALT 24  ALKPHOS 43  BILITOT 0.5  PROT 7.0  ALBUMIN 3.4*    CBC:  Recent Labs Lab 10/04/13 1255 10/05/13 0258 10/06/13 0526 10/07/13 0703 10/08/13 0445  WBC 15.7* 14.9* 12.6* 10.8* 13.3*  NEUTROABS 13.4*  --   --   --   --   HGB 16.6* 13.6 14.2 13.4 12.1  HCT 50.0* 42.0 41.7 38.9 35.1*  MCV 101.0* 101.2* 97.4 95.1 95.4  PLT 212 158 141* 137* 160   Cardiac Enzymes:  Recent Labs Lab 10/04/13 1820  TROPONINI <0.30   CBG:  Recent Labs Lab 10/08/13 10/08/13 0611 10/08/13 1728 10/09/13 0008 10/09/13 0634  GLUCAP 170* 140* 104* 84 77     IMAGING STUDIES Dg Chest Port 1 View  10/07/2013   CLINICAL DATA:  Cough  EXAM: PORTABLE CHEST - 1 VIEW  COMPARISON:  10/05/2013  FINDINGS: Heart size and vascular pattern are normal. Right lung is clear. There is mild hazy opacity at the left base which was not present previously.  IMPRESSION: Development of mild opacity at the left base which could represent atelectasis or pneumonia.   Electronically Signed   By: Skipper Cliche M.D.   On: 10/07/2013 09:19   Portable Chest 1 View  10/05/2013   CLINICAL DATA:  Altered mental status.  EXAM: PORTABLE CHEST - 1 VIEW  COMPARISON:  10/04/2013.  FINDINGS: Mediastinum and hilar structures are normal. Heart size and pulmonary vascularity stable. Mild atelectatic changes in both lung bases. Mild pneumonitis in the bases cannot be excluded. No pleural effusion or pneumothorax. Tiny calcified pulmonary nodules consistent with granulomas . Prior thoracoplasty. No acute osseous  abnormality.  IMPRESSION: Persistent mild basilar atelectasis. Mild basilar pneumonitis cannot be excluded.   Electronically Signed   By: Marcello Moores  Register   On: 10/05/2013 07:33   Dg Chest Port 1 View  10/04/2013   CLINICAL DATA:  Altered mental status  EXAM: PORTABLE CHEST - 1 VIEW  COMPARISON:  Portable exam 1252 hr compared to 08/24/2012  FINDINGS: Rotation to the right.  Upper normal heart size.  Atherosclerotic calcification aorta.  Mediastinal contours and pulmonary vascularity  otherwise normal.  Emphysematous and bronchitic changes with minimal right basilar atelectasis.  No acute infiltrate, pleural effusion, or pneumothorax.  Diffuse osseous demineralization with prior spinal augmentation procedure at lower thoracic spine.  Calcified granuloma right apex.  Skin folds project over left chest.  IMPRESSION: COPD changes with minimal right basilar atelectasis.   Electronically Signed   By: Lavonia Dana M.D.   On: 10/04/2013 13:05   Dg Swallowing Func-speech Pathology  10/08/2013   Katherene Ponto Deblois, CCC-SLP     10/08/2013  1:45 PM Objective Swallowing Evaluation: Modified Barium Swallowing Study   Patient Details  Name: [name] Davidson MRN: [id number] Date of Birth: 1922/09/12  Today's Date: 10/08/2013 Time: 8527-7824 SLP Time Calculation (min): 32 min  Past Medical History:  Past Medical History  Diagnosis Date  . Dementia   . Osteoarthritis   . Hypertension   . Thyroid disease   . Renal disorder    Past Surgical History:  Past Surgical History  Procedure Laterality Date  . Cyst removal neck    . Total abdominal hysterectomy    . Cholecystectomy     HPI:  80 year old white female presents emergency department via EMS  with chief complaint of altered mental status. History obtained  from the patient's daughter who is her primary caregiver. Per her  daughter the patient is prone to urinary tract infections. She  reports the patient has been lethargic and altered for 2  daysPatient has a past medical history of dementia,  osteoarthritis, hypertension, thyroid disease, renal disorder,  multiple UTIs in the past. She is bedridden for the most part but  is able to ambulate minimally with assistance and walker at  baseline. Dx include Sepsis - uncertain etiology: possibly  influenza versus aspiration pneumonia; acute encephalopathy;  hypernatremia with severe dehydration      Assessment / Plan / Recommendation Clinical Impression  Dysphagia Diagnosis: Mild cervical esophageal phase   dysphagia;Mild pharyngeal phase dysphagia;Mild oral phase  dysphagia Clinical impression: Pt demonstrates a mild to moderate  pharyngeal dysphagia limited primarily by poor head support  leading to vulnerabilty of airway before and during the swallow.  Due to anterior/left leaning posture, pts open airway collects  thin and nectar thick liquids before and during the swallow (as  the valleculae typically does). The strength of the pts swallow  is actually very good, expelling the majority of penetrate and  transiting most of bolus. A cued throat clear helps pt expel  penetrate.   Overall, pt is at moderate aspiration risk with probable small  aspiration events during meals without sensation. Pt should be  frequently respositioned during the meal for best head control,  offered small staff controlled straw sips (pt drinks too fast),  and encouraged to clear her throat frequently. Dys 3 (mech soft)  diet with thin liquids is recommended.     Treatment Recommendation  Therapy as outlined in treatment plan below    Diet Recommendation Dysphagia 3 (Mechanical Soft);Thin liquid   Liquid Administration via: Cup;Straw Medication  Administration: Whole meds with puree Supervision: Staff to assist with self feeding Compensations: Slow rate;Small sips/bites;Clear throat  intermittently Postural Changes and/or Swallow Maneuvers: Seated upright 90  degrees    Other  Recommendations Oral Care Recommendations: Oral care BID   Follow Up Recommendations  Skilled Nursing facility    Frequency and Duration min 2x/week  2 weeks   Pertinent Vitals/Pain NA    SLP Swallow Goals     General HPI: 80 year old white female presents emergency  department via EMS with chief complaint of altered mental status.  History obtained from the patient's daughter who is her primary  caregiver. Per her daughter the patient is prone to urinary tract  infections. She reports the patient has been lethargic and  altered for 2 daysPatient has a past medical  history of dementia,  osteoarthritis, hypertension, thyroid disease, renal disorder,  multiple UTIs in the past. She is bedridden for the most part but  is able to ambulate minimally with assistance and walker at  baseline. Dx include Sepsis - uncertain etiology: possibly  influenza versus aspiration pneumonia; acute encephalopathy;  hypernatremia with severe dehydration  Type of Study: Modified Barium Swallowing Study Reason for Referral: Objectively evaluate swallowing function Previous Swallow Assessment: none per records Diet Prior to this Study: Dysphagia 3 (soft);Thin liquids Temperature Spikes Noted: No Respiratory Status: Nasal cannula History of Recent Intubation: No Behavior/Cognition: Alert;Cooperative Oral Cavity - Dentition: Missing dentition Oral Motor / Sensory Function: Within functional limits Self-Feeding Abilities: Needs assist Patient Positioning: Postural control interferes with function Baseline Vocal Quality: Clear Volitional Cough: Weak Volitional Swallow: Unable to elicit Anatomy: Within functional limits    Reason for Referral Objectively evaluate swallowing function   Oral Phase Oral Preparation/Oral Phase Oral Phase: Impaired Oral - Nectar Oral - Nectar Cup: Reduced posterior propulsion;Lingual/palatal  residue;Delayed oral transit Oral - Thin Oral - Thin Cup: Reduced posterior propulsion;Lingual/palatal  residue;Delayed oral transit Oral - Thin Straw: Reduced posterior propulsion;Lingual/palatal  residue;Delayed oral transit Oral - Solids Oral - Puree: Reduced posterior propulsion;Lingual/palatal  residue;Delayed oral transit Oral - Mechanical Soft: Reduced posterior  propulsion;Lingual/palatal residue;Delayed oral transit Oral - Pill: Reduced posterior propulsion;Lingual/palatal  residue;Delayed oral transit;Holding of bolus;Pocketing in  anterior sulcus (did not transit pill)   Pharyngeal Phase Pharyngeal Phase Pharyngeal Phase: Impaired Pharyngeal - Nectar Pharyngeal - Nectar Cup:  Delayed swallow initiation;Premature  spillage to pyriform sinuses;Penetration/Aspiration before  swallow;Trace aspiration;Pharyngeal residue - cp  segment;Pharyngeal residue - pyriform sinuses;Compensatory  strategies attempted (Comment) (throat clear) Penetration/Aspiration details (nectar cup): Material enters  airway, CONTACTS cords and not ejected out Pharyngeal - Thin Pharyngeal - Thin Cup: Delayed swallow initiation;Premature  spillage to pyriform sinuses;Penetration/Aspiration before  swallow;Trace aspiration;Pharyngeal residue - cp  segment;Pharyngeal residue - pyriform sinuses;Compensatory  strategies attempted (Comment) Penetration/Aspiration details (thin cup): Material enters  airway, CONTACTS cords and not ejected out Pharyngeal - Thin Straw: Delayed swallow initiation;Premature  spillage to pyriform sinuses;Penetration/Aspiration before  swallow;Trace aspiration;Pharyngeal residue - cp  segment;Pharyngeal residue - pyriform sinuses;Compensatory  strategies attempted (Comment) Penetration/Aspiration details (thin straw): Material enters  airway, CONTACTS cords and not ejected out Pharyngeal - Solids Pharyngeal - Puree: Delayed swallow initiation;Pharyngeal residue  - cp segment;Pharyngeal residue - pyriform sinuses;Compensatory  strategies attempted (Comment) Penetration/Aspiration details (puree): Material does not enter  airway Pharyngeal - Mechanical Soft: Delayed swallow  initiation;Pharyngeal residue - cp segment;Pharyngeal residue -  pyriform sinuses;Compensatory strategies attempted (Comment) Pharyngeal - Pill: Not tested  Cervical Esophageal Phase    GO    Cervical Esophageal Phase Cervical  Esophageal Phase: Impaired        Herbie Baltimore, MA CCC-SLP [phone]  Lynann Beaver 10/08/2013, 1:44 PM     DISCHARGE EXAMINATION: Filed Vitals:   10/08/13 1823 10/08/13 2217 10/09/13 0512 10/09/13 0848  BP: 141/85 134/72 122/75 130/64  Pulse: 72 92 77 80  Temp: 98 F (36.7 C) 98.9 F  (37.2 C) 98.7 F (37.1 C) 97.7 F (36.5 C)  TempSrc: Axillary Axillary Axillary Axillary  Resp: 18 20 20 16   Height:      Weight:  46.131 kg (101 lb 11.2 oz)    SpO2: 91% 98% 98% 97%   General appearance: alert, cooperative, appears stated age, distracted and no distress Resp: few crackles at bases. No wheezing Cardio: regular rate and rhythm, S1, S2 normal, no murmur, click, rub or gallop Neurologic: Bedbound. Confused at baseline. No focal deficits.  DISPOSITION: Home with daughter  Discharge Orders   Future Orders Complete By Expires   Discharge diet:  As directed    Comments:     Dysphagia 3 Diet with thin liquids   Discharge instructions  As directed    Comments:     Patient should be sitting upright while eating or drinking to avoid aspiration.   Increase activity slowly  As directed       ALLERGIES:  Allergies  Allergen Reactions  . Sulfa Drugs Cross Reactors Other (See Comments)    unknown  . Contrast Media [Iodinated Diagnostic Agents] Rash    Current Discharge Medication List    START taking these medications   Details  amoxicillin-clavulanate (AUGMENTIN) 400-57 MG/5ML suspension Take 10 mLs (800 mg total) by mouth every 12 (twelve) hours. For 5 more days Qty: 100 mL, Refills: 0    feeding supplement, ENSURE COMPLETE, (ENSURE COMPLETE) LIQD Take 237 mLs by mouth 3 (three) times daily between meals. Qty: 90 Bottle, Refills: 0    megestrol (MEGACE) 40 MG/ML suspension Take 5 mLs (200 mg total) by mouth daily. Qty: 240 mL, Refills: 0      CONTINUE these medications which have CHANGED   Details  risperiDONE (RISPERDAL) 0.25 MG tablet Take 1 tablet (0.25 mg total) by mouth 2 (two) times daily. Qty: 60 tablet, Refills: 1      CONTINUE these medications which have NOT CHANGED   Details  gabapentin (NEURONTIN) 300 MG capsule Take 300 mg by mouth at bedtime.    HYDROcodone-acetaminophen (NORCO) 10-325 MG per tablet Take 1-2 tablets by mouth every 6 (six)  hours as needed. For pain    levothyroxine (SYNTHROID, LEVOTHROID) 50 MCG tablet Take 50 mcg by mouth at bedtime.    memantine (NAMENDA) 10 MG tablet Take 10 mg by mouth 2 (two) times daily.    methocarbamol (ROBAXIN) 500 MG tablet Take 500 mg by mouth every morning.    metoprolol succinate (TOPROL-XL) 25 MG 24 hr tablet Take 25 mg by mouth daily.    mirtazapine (REMERON) 30 MG tablet Take 30 mg by mouth at bedtime.    OVER THE COUNTER MEDICATION Take 1 tablet by mouth daily. Protandim Supplement      STOP taking these medications     amLODipine (NORVASC) 5 MG tablet        Follow-up Information   Follow up with Woody Seller, MD. Schedule an appointment as soon as possible for a visit in 1 week.   Specialty:  Family Medicine   Contact information:   [address] [place] [address] [place] [address] [phone]  TOTAL DISCHARGE TIME: 35 mins  [facility] Pager [phone]  10/09/2013, 9:18 AM

## 2013-10-10 LAB — CULTURE, BLOOD (ROUTINE X 2): Culture: NO GROWTH

## 2013-10-10 LAB — GLUCOSE, CAPILLARY
GLUCOSE-CAPILLARY: 87 mg/dL (ref 70–99)
Glucose-Capillary: 83 mg/dL (ref 70–99)

## 2013-10-10 NOTE — Progress Notes (Signed)
Patient discharged to home. Patient AVS reviewed with daughter via telephone and home health contact numbers provided to daughter by case manager.  Patient remains stable; no signs or symptoms of distress. Patient transported via EMS with belongings at her side. Prescriptions and AVS copy transported by EMS, to be given to pt's daughter upon arrival home.

## 2013-10-10 NOTE — Clinical Social Work Note (Signed)
LATE ENTRY: Patient medically stable for discharge home today. CSW facilitated transport home via ambulance.  Genelle BalVanessa Jenessa Gillingham, MSW, LCSW [phone]

## 2013-10-10 NOTE — Progress Notes (Signed)
NUTRITION FOLLOW-UP  DOCUMENTATION CODES Per approved criteria  -Severe malnutrition in the context of chronic illness   INTERVENTION: Continue Ensure Complete po BID, each supplement provides 350 kcal and 13 grams of protein. RD to continue to follow nutrition care plan.  NUTRITION DIAGNOSIS: Inadequate oral intake related to dementia and poor appetite as evidenced by dehydration and severe fat/muscle mass wasting. Ongoing.   Goal: Diet advancement as tolerated. Met. Maximize intake within [place].  Monitor:  weight trends, lab trends, I/O's, diet advancement, PO intake  ASSESSMENT: PMHx significant for dementia, osteoarthritis, HTN, thyroid disease, renal disorder. Pt is from home with daughter; primarily bedridden. Admitted with AMS x 2 hr and febrile. Work-up reveals CAP, severe dehydration. Influenza panel negative.  Per chart review, pt on Remeron at home.   BSE completed 1/16 recommendations are for Dysphagia 3 diet with thin liquids.  MBSS completed 1/19 which confirmed Dysphagia 3 diet with thin liquids. Meal intake remains minimal, pt is consuming 25% of meals. WOC RN saw pt on 1/16 - pt with R ischium pressure ulcer  Pt meets criteria for severe MALNUTRITION in the context of chronic illness as evidenced by severe fat and muscle mass loss. Also, suspect intake of <75% x at least 1 month.  Pt with d/c orders yesterday, however daughter stated that she was unable to accept pt at home and needed to wait until today.  Admission sodium was 160, currently down to 143. Pt is currently receiving D5 with KCl at 20 ml/hr. Potassium WNL. Most recent CBG's now WNL - 87 and 83  Height: Ht Readings from Last 1 Encounters:  10/04/13 5' (1.524 m)    Weight: Wt Readings from Last 1 Encounters:  10/09/13 102 lb 4.8 oz (46.403 kg)  Admit wt 97 lb  BMI:  Body mass index is 19.98 kg/(m^2). Normal weight  Estimated Nutritional Needs: Kcal: 1400 - 1600 Protein: 50 - 65 g Fluid:  1.4 - 1.6 liters  Skin:  Stage I pressure ulcer to R ankle Stage I pressure ulcer to L ankle R buttocks partial thickness wound Blister to R shoulder  Diet Order: Dysphagia 3; thins  EDUCATION NEEDS: -No education needs identified at this time   Intake/Output Summary (Last 24 hours) at 10/10/13 0952 Last data filed at 10/09/13 1900  Gross per 24 hour  Intake    840 ml  Output      0 ml  Net    840 ml    Last BM: 1/20  Labs:   Recent Labs Lab 10/06/13 0526 10/07/13 0703 10/08/13 0445  NA 141 141 143  K 3.2* 3.9 4.0  CL 107 105 104  CO2 21 23 29   BUN 17 11 14   CREATININE 0.55 0.60 0.63  CALCIUM 7.8* 8.3* 8.7  GLUCOSE 124* 100* 148*    CBG (last 3)   Recent Labs  10/09/13 1637 10/10/13 0012 10/10/13 0634  GLUCAP 120* 87 83    Scheduled Meds: . amoxicillin-clavulanate  800 mg Oral Q12H  . collagenase   Topical Daily  . feeding supplement (ENSURE COMPLETE)  237 mL Oral TID BM  . guaiFENesin  200 mg Oral Q6H  . heparin  5,000 Units Subcutaneous Q8H  . levothyroxine  50 mcg Oral QHS  . megestrol  200 mg Oral Daily  . memantine  10 mg Oral BID  . metoprolol tartrate  12.5 mg Oral BID  . risperiDONE  0.25 mg Oral BID  . sodium chloride  3 mL Intravenous Q12H  Continuous Infusions: . dextrose 5 % with KCl 20 mEq / L 20 mEq (10/09/13 1823)    Inda Coke MS, RD, LDN Pager: [phone] After-hours pager: [phone]

## 2013-10-10 NOTE — Progress Notes (Signed)
[facility] PROGRESS NOTE  [name] Davidson VHQ:[id number] DOB: Oct 23, 1921 DOA: 10/04/2013 PCP: Woody Seller, MD  Assessment/Plan: Sepsis possibly due to pneumonia.  - resolved. Treated with empiric abx. Discharge on Augmentin.  Acute encephalopathy  - secondary to infection, dehydration and hypernatremia. Now  back to her baseline.   Aspiration versus Community acquired pneumonia  Patient was started on vancomycin, Zosyn, Tamiflu. Influenza PCR negative. Tamiflu was stopped.  changed to Augmentin on d/c  Dysphagia  MBS done and patient was seen by SLP. She have risk for aspiration due to mild to moderate pharyngeal dysphagia. . Continue Dys 3 diet with thin liquids.   COPD stable. Continue home medications.   Hypernatremia with severe dehydration and ARF and Hypokalemia  Resolved with hydration  Hypothyroidism  Continue Synthroid.   Hypertension  BP stable with BB. stopped Amlodipine to avoid hypotension  Patient on several psychotropics - possibly for dementia +/- agitation  Mental status has improved. Incremental initiation of her home medications. Dose of risperdal decreased.   Suspected failure to thrive/Severe Protien Calorie Malnutrition  Long-standing history of dementia, suspect very poor functional status at baseline.  Started megace and ensure. Now DNR . Will need to discusses further GOC in future if declines further.  Code Status: DNR      HPI/Subjective: Pt seen and examined. Unable to discharge home yesterday as daughter did not agree to take her home. No overnight issues  Objective: Filed Vitals:   10/10/13 0521  BP: 138/75  Pulse: 89  Temp: 98.7 F (37.1 C)  Resp: 20    Intake/Output Summary (Last 24 hours) at 10/10/13 0900 Last data filed at 10/09/13 1900  Gross per 24 hour  Intake   1080 ml  Output      0 ml  Net   1080 ml   Filed Weights   10/06/13 2242 10/08/13 2217 10/09/13 2228  Weight: 47.5 kg (104 lb 11.5 oz) 46.131 kg  (101 lb 11.2 oz) 46.403 kg (102 lb 4.8 oz)    Exam:  General : elderly female in NAD chest: few crackles at bases. No wheezing  Cardio: regular rate and rhythm, S1, S2 normal, no murmur,  rub or gallop  Abd: soft, NT, ND BS+ Ext: warm, no edema Neurologic: Bedbound. Confused at baseline. No focal deficits.   Data Reviewed: Basic Metabolic Panel:  Recent Labs Lab 10/05/13 0258 10/05/13 1340 10/06/13 0526 10/07/13 0703 10/08/13 0445  NA 157* 153* 141 141 143  K 3.5* 3.3* 3.2* 3.9 4.0  CL 121* 115* 107 105 104  CO2 22 21 21 23 29   GLUCOSE 125* 150* 124* 100* 148*  BUN 47* 36* 17 11 14   CREATININE 0.88 0.75 0.55 0.60 0.63  CALCIUM 8.0* 7.9* 7.8* 8.3* 8.7   Liver Function Tests:  Recent Labs Lab 10/04/13 1255  AST 57*  ALT 24  ALKPHOS 43  BILITOT 0.5  PROT 7.0  ALBUMIN 3.4*   No results found for this basename: LIPASE, AMYLASE,  in the last 168 hours No results found for this basename: AMMONIA,  in the last 168 hours CBC:  Recent Labs Lab 10/04/13 1255 10/05/13 0258 10/06/13 0526 10/07/13 0703 10/08/13 0445  WBC 15.7* 14.9* 12.6* 10.8* 13.3*  NEUTROABS 13.4*  --   --   --   --   HGB 16.6* 13.6 14.2 13.4 12.1  HCT 50.0* 42.0 41.7 38.9 35.1*  MCV 101.0* 101.2* 97.4 95.1 95.4  PLT 212 158 141* 137* 160   Cardiac Enzymes:  Recent  Labs Lab 10/04/13 1820  TROPONINI <0.30   BNP (last 3 results) No results found for this basename: PROBNP,  in the last 8760 hours CBG:  Recent Labs Lab 10/09/13 0634 10/09/13 1210 10/09/13 1637 10/10/13 0012 10/10/13 0634  GLUCAP 77 87 120* 87 83    Recent Results (from the past 240 hour(s))  CULTURE, BLOOD (ROUTINE X 2)     Status: None   Collection Time    10/04/13 12:57 PM      Result Value Range Status   Specimen Description BLOOD LEFT FOREARM   Final   Special Requests BOTTLES DRAWN AEROBIC AND ANAEROBIC 10CC   Final   Culture  Setup Time     Final   Value: 10/04/2013 16:26     Performed at [facility]   Culture     Final   Value: DIPHTHEROIDS(CORYNEBACTERIUM SPECIES)     Note: Standardized susceptibility testing for this organism is not available.     Note: Gram Stain Report Called to,Read Back By and Verified With: Donato Heinz [id number] BRMEL     Performed at [facility]   Report Status 10/07/2013 FINAL   Final  URINE CULTURE     Status: None   Collection Time    10/04/13  1:28 PM      Result Value Range Status   Specimen Description URINE, CATHETERIZED   Final   Special Requests NONE   Final   Culture  Setup Time     Final   Value: 10/04/2013 14:42     Performed at [place]     Final   Value: NO GROWTH     Performed at [facility]   Culture     Final   Value: NO GROWTH     Performed at [facility]   Report Status 10/05/2013 FINAL   Final  CULTURE, BLOOD (ROUTINE X 2)     Status: None   Collection Time    10/04/13  1:40 PM      Result Value Range Status   Specimen Description BLOOD ARM LEFT   Final   Special Requests BOTTLES DRAWN AEROBIC ONLY 5CC   Final   Culture  Setup Time     Final   Value: 10/04/2013 16:26     Performed at [facility]   Culture     Final   Value: NO GROWTH 5 DAYS     Performed at [facility]   Report Status 10/10/2013 FINAL   Final  RESPIRATORY VIRUS PANEL     Status: None   Collection Time    10/04/13  3:32 PM      Result Value Range Status   Source - RVPAN NASAL SWAB   Corrected   Comment: CORRECTED ON 01/16 AT 2216: PREVIOUSLY REPORTED AS NASAL SWAB   Respiratory Syncytial Virus A NOT DETECTED   Final   Respiratory Syncytial Virus B NOT DETECTED   Final   Influenza A NOT DETECTED   Final   Influenza B NOT DETECTED   Final   Parainfluenza 1 NOT DETECTED   Final   Parainfluenza 2 NOT DETECTED   Final   Parainfluenza 3 NOT DETECTED   Final   Metapneumovirus NOT DETECTED   Final   Rhinovirus NOT DETECTED   Final   Adenovirus NOT DETECTED   Final    Influenza A H1 NOT DETECTED   Final   Influenza A H3  NOT DETECTED   Final   Comment: (NOTE)           Normal Reference Range for each Analyte: NOT DETECTED     Testing performed using the Luminex xTAG Respiratory Viral Panel test     kit.     This test was developed and its performance characteristics determined     by [facility]. It has not been cleared or approved by the [place]     Food and Drug Administration. This test is used for clinical purposes.     It should not be regarded as investigational or for research. This     laboratory is certified under the [place] (CLIA) as qualified to perform high complexity     clinical laboratory testing.     Performed at [facility] PCR SCREENING     Status: None   Collection Time    10/04/13  4:39 PM      Result Value Range Status   MRSA by PCR NEGATIVE  NEGATIVE Final   Comment:            The GeneXpert MRSA Assay (FDA     approved for NASAL specimens     only), is one component of a     comprehensive MRSA colonization     surveillance program. It is not     intended to diagnose MRSA     infection nor to guide or     monitor treatment for     MRSA infections.     Studies: Dg Swallowing Func-speech Pathology  10/08/2013   Katherene Ponto [place], CCC-SLP     10/08/2013  1:45 PM Objective Swallowing Evaluation: Modified Barium Swallowing Study   Patient Details  Name: [name] Davidson MRN: [id number] Date of Birth: 1922/01/16  Today's Date: 10/08/2013 Time: 0175-1025 SLP Time Calculation (min): 32 min  Past Medical History:  Past Medical History  Diagnosis Date  . Dementia   . Osteoarthritis   . Hypertension   . Thyroid disease   . Renal disorder    Past Surgical History:  Past Surgical History  Procedure Laterality Date  . Cyst removal neck    . Total abdominal hysterectomy    . Cholecystectomy     HPI:  80 year old white female presents emergency department via EMS  with chief  complaint of altered mental status. History obtained  from the patient's daughter who is her primary caregiver. Per her  daughter the patient is prone to urinary tract infections. She  reports the patient has been lethargic and altered for 2  daysPatient has a past medical history of dementia,  osteoarthritis, hypertension, thyroid disease, renal disorder,  multiple UTIs in the past. She is bedridden for the most part but  is able to ambulate minimally with assistance and walker at  baseline. Dx include Sepsis - uncertain etiology: possibly  influenza versus aspiration pneumonia; acute encephalopathy;  hypernatremia with severe dehydration      Assessment / Plan / Recommendation Clinical Impression  Dysphagia Diagnosis: Mild cervical esophageal phase  dysphagia;Mild pharyngeal phase dysphagia;Mild oral phase  dysphagia Clinical impression: Pt demonstrates a mild to moderate  pharyngeal dysphagia limited primarily by poor head support  leading to vulnerabilty of airway before and during the swallow.  Due to anterior/left leaning posture, pts open airway collects  thin and nectar thick liquids before and during the swallow (as  the valleculae typically does). The strength of the  pts swallow  is actually very good, expelling the majority of penetrate and  transiting most of bolus. A cued throat clear helps pt expel  penetrate.   Overall, pt is at moderate aspiration risk with probable small  aspiration events during meals without sensation. Pt should be  frequently respositioned during the meal for best head control,  offered small staff controlled straw sips (pt drinks too fast),  and encouraged to clear her throat frequently. Dys 3 (mech soft)  diet with thin liquids is recommended.     Treatment Recommendation  Therapy as outlined in treatment plan below    Diet Recommendation Dysphagia 3 (Mechanical Soft);Thin liquid   Liquid Administration via: Cup;Straw Medication Administration: Whole meds with puree  Supervision: Staff to assist with self feeding Compensations: Slow rate;Small sips/bites;Clear throat  intermittently Postural Changes and/or Swallow Maneuvers: Seated upright 90  degrees    Other  Recommendations Oral Care Recommendations: Oral care BID   Follow Up Recommendations  Skilled Nursing facility    Frequency and Duration min 2x/week  2 weeks   Pertinent Vitals/Pain NA    SLP Swallow Goals     General HPI: 80 year old white female presents emergency  department via EMS with chief complaint of altered mental status.  History obtained from the patient's daughter who is her primary  caregiver. Per her daughter the patient is prone to urinary tract  infections. She reports the patient has been lethargic and  altered for 2 daysPatient has a past medical history of dementia,  osteoarthritis, hypertension, thyroid disease, renal disorder,  multiple UTIs in the past. She is bedridden for the most part but  is able to ambulate minimally with assistance and walker at  baseline. Dx include Sepsis - uncertain etiology: possibly  influenza versus aspiration pneumonia; acute encephalopathy;  hypernatremia with severe dehydration  Type of Study: Modified Barium Swallowing Study Reason for Referral: Objectively evaluate swallowing function Previous Swallow Assessment: none per records Diet Prior to this Study: Dysphagia 3 (soft);Thin liquids Temperature Spikes Noted: No Respiratory Status: Nasal cannula History of Recent Intubation: No Behavior/Cognition: Alert;Cooperative Oral Cavity - Dentition: Missing dentition Oral Motor / Sensory Function: Within functional limits Self-Feeding Abilities: Needs assist Patient Positioning: Postural control interferes with function Baseline Vocal Quality: Clear Volitional Cough: Weak Volitional Swallow: Unable to elicit Anatomy: Within functional limits    Reason for Referral Objectively evaluate swallowing function   Oral Phase Oral Preparation/Oral Phase Oral Phase: Impaired Oral -  Nectar Oral - Nectar Cup: Reduced posterior propulsion;Lingual/palatal  residue;Delayed oral transit Oral - Thin Oral - Thin Cup: Reduced posterior propulsion;Lingual/palatal  residue;Delayed oral transit Oral - Thin Straw: Reduced posterior propulsion;Lingual/palatal  residue;Delayed oral transit Oral - Solids Oral - Puree: Reduced posterior propulsion;Lingual/palatal  residue;Delayed oral transit Oral - Mechanical Soft: Reduced posterior  propulsion;Lingual/palatal residue;Delayed oral transit Oral - Pill: Reduced posterior propulsion;Lingual/palatal  residue;Delayed oral transit;Holding of bolus;Pocketing in  anterior sulcus (did not transit pill)   Pharyngeal Phase Pharyngeal Phase Pharyngeal Phase: Impaired Pharyngeal - Nectar Pharyngeal - Nectar Cup: Delayed swallow initiation;Premature  spillage to pyriform sinuses;Penetration/Aspiration before  swallow;Trace aspiration;Pharyngeal residue - cp  segment;Pharyngeal residue - pyriform sinuses;Compensatory  strategies attempted (Comment) (throat clear) Penetration/Aspiration details (nectar cup): Material enters  airway, CONTACTS cords and not ejected out Pharyngeal - Thin Pharyngeal - Thin Cup: Delayed swallow initiation;Premature  spillage to pyriform sinuses;Penetration/Aspiration before  swallow;Trace aspiration;Pharyngeal residue - cp  segment;Pharyngeal residue - pyriform sinuses;Compensatory  strategies attempted (Comment) Penetration/Aspiration details (thin cup): Material enters  airway,  CONTACTS cords and not ejected out Pharyngeal - Thin Straw: Delayed swallow initiation;Premature  spillage to pyriform sinuses;Penetration/Aspiration before  swallow;Trace aspiration;Pharyngeal residue - cp  segment;Pharyngeal residue - pyriform sinuses;Compensatory  strategies attempted (Comment) Penetration/Aspiration details (thin straw): Material enters  airway, CONTACTS cords and not ejected out Pharyngeal - Solids Pharyngeal - Puree: Delayed swallow  initiation;Pharyngeal residue  - cp segment;Pharyngeal residue - pyriform sinuses;Compensatory  strategies attempted (Comment) Penetration/Aspiration details (puree): Material does not enter  airway Pharyngeal - Mechanical Soft: Delayed swallow  initiation;Pharyngeal residue - cp segment;Pharyngeal residue -  pyriform sinuses;Compensatory strategies attempted (Comment) Pharyngeal - Pill: Not tested  Cervical Esophageal Phase    GO    Cervical Esophageal Phase Cervical Esophageal Phase: Impaired        Herbie Baltimore, MA CCC-SLP [phone]  [place], Katherene Ponto 10/08/2013, 1:44 PM     Scheduled Meds: . amoxicillin-clavulanate  800 mg Oral Q12H  . collagenase   Topical Daily  . feeding supplement (ENSURE COMPLETE)  237 mL Oral TID BM  . guaiFENesin  200 mg Oral Q6H  . heparin  5,000 Units Subcutaneous Q8H  . levothyroxine  50 mcg Oral QHS  . megestrol  200 mg Oral Daily  . memantine  10 mg Oral BID  . metoprolol tartrate  12.5 mg Oral BID  . risperiDONE  0.25 mg Oral BID  . sodium chloride  3 mL Intravenous Q12H   Continuous Infusions: . dextrose 5 % with KCl 20 mEq / L 20 mEq (10/09/13 1823)      Time spent: 25 minutes    Jessy Cybulski, [place]  [facility] Pager [phone]. If 7PM-7AM, please contact night-coverage at [email or web address], password [facility] 10/10/2013, 9:00 AM  LOS: 6 days

## 2013-11-18 DEATH — deceased

## 2015-01-10 IMAGING — CR DG CHEST 1V PORT
1 series · 1 of 1 positions shown · non-contrast
Comparison: Portable exam 7848 hr compared to 08/24/2012

CLINICAL DATA: Altered mental status

EXAM:
PORTABLE CHEST - 1 VIEW

[AP]
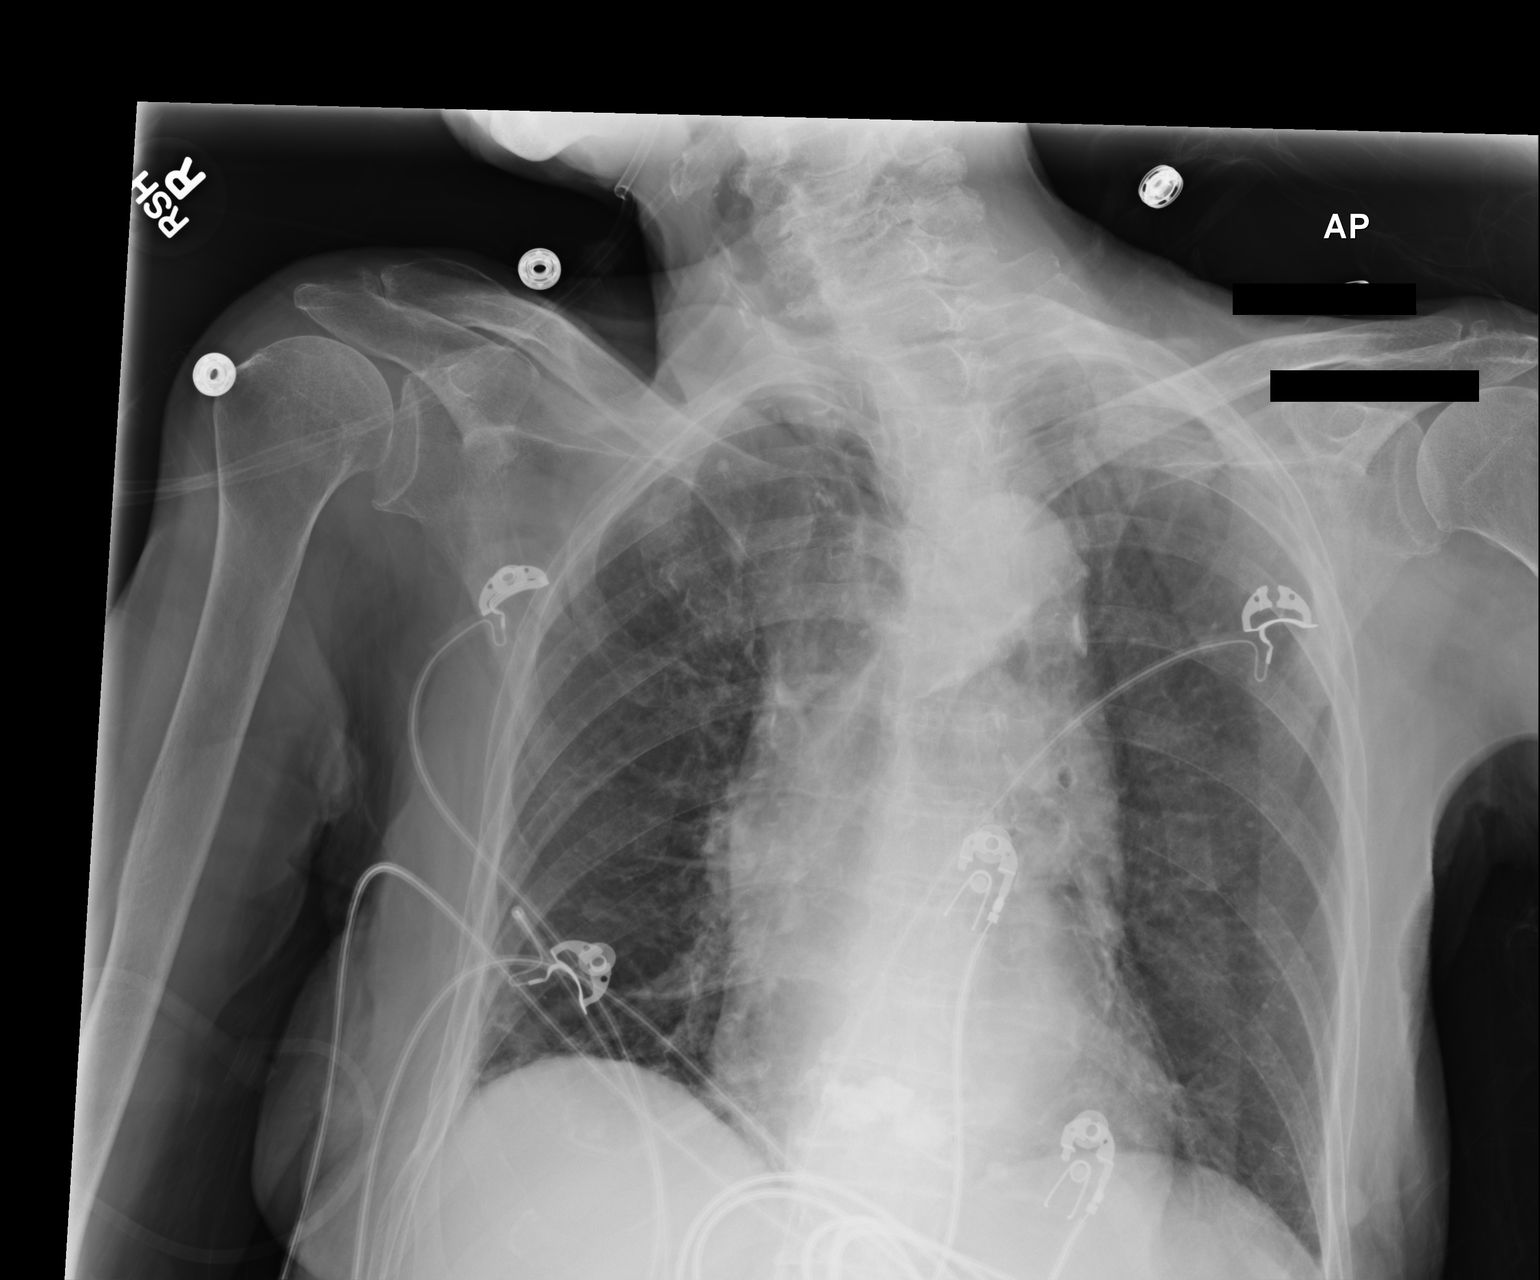

[1 of 1 positions shown; findings below may reference images not displayed]

FINDINGS: Rotation to the right.

Upper normal heart size.

Atherosclerotic calcification aorta.

Mediastinal contours and pulmonary vascularity otherwise normal.

Emphysematous and bronchitic changes with minimal right basilar
atelectasis.

No acute infiltrate, pleural effusion, or pneumothorax.

Diffuse osseous demineralization with prior spinal augmentation
procedure at lower thoracic spine.

Calcified granuloma right apex.

Skin folds project over left chest.
IMPRESSION: COPD changes with minimal right basilar atelectasis.

## 2015-01-11 IMAGING — CR DG CHEST 1V PORT
1 series · 1 of 1 positions shown · non-contrast
Comparison: 10/04/2013.

CLINICAL DATA: Altered mental status.

EXAM:
PORTABLE CHEST - 1 VIEW

[AP]
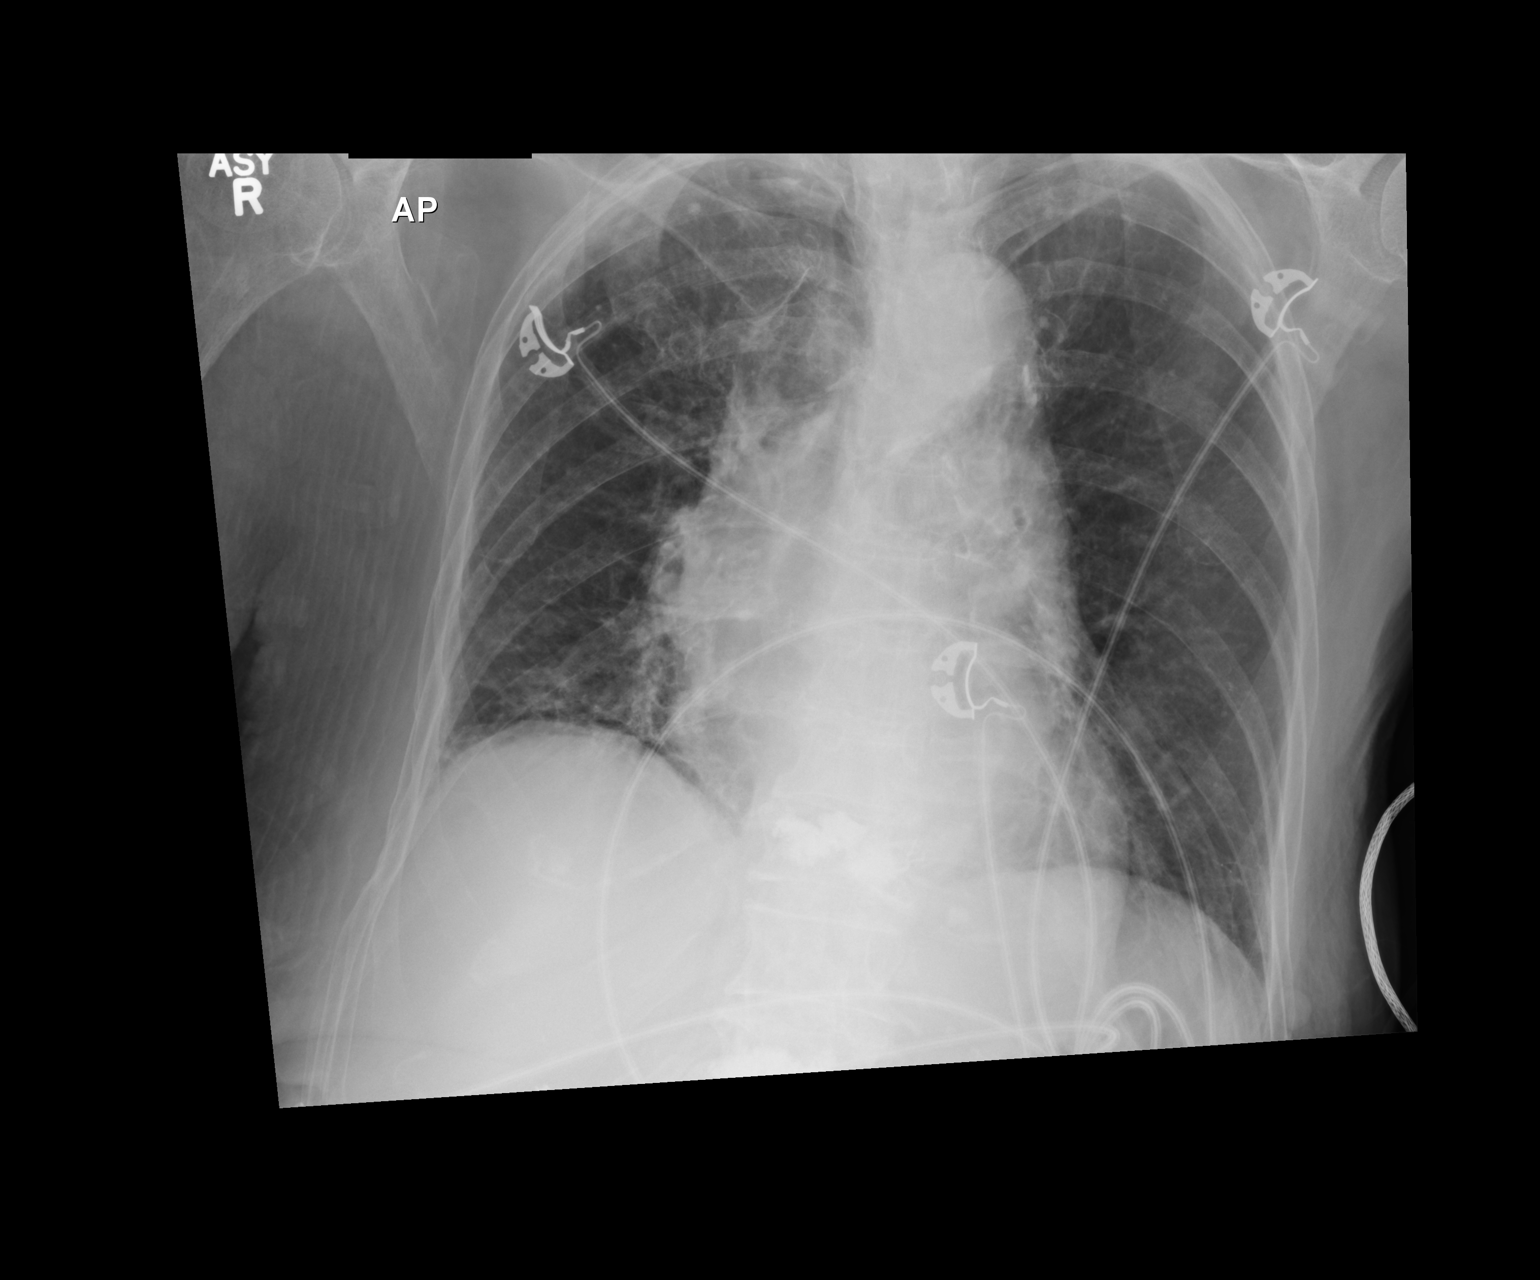

[1 of 1 positions shown; findings below may reference images not displayed]

FINDINGS: Mediastinum and hilar structures are normal. Heart size and
pulmonary vascularity stable. Mild atelectatic changes in both lung
bases. Mild pneumonitis in the bases cannot be excluded. No pleural
effusion or pneumothorax. Tiny calcified pulmonary nodules
consistent with granulomas . Prior thoracoplasty. No acute osseous
abnormality.
IMPRESSION: Persistent mild basilar atelectasis. Mild basilar pneumonitis cannot
be excluded.

## 2015-01-13 IMAGING — CR DG CHEST 1V PORT
1 series · 1 of 1 positions shown · non-contrast
Comparison: 10/05/2013

CLINICAL DATA: Cough

EXAM:
PORTABLE CHEST - 1 VIEW

[AP]
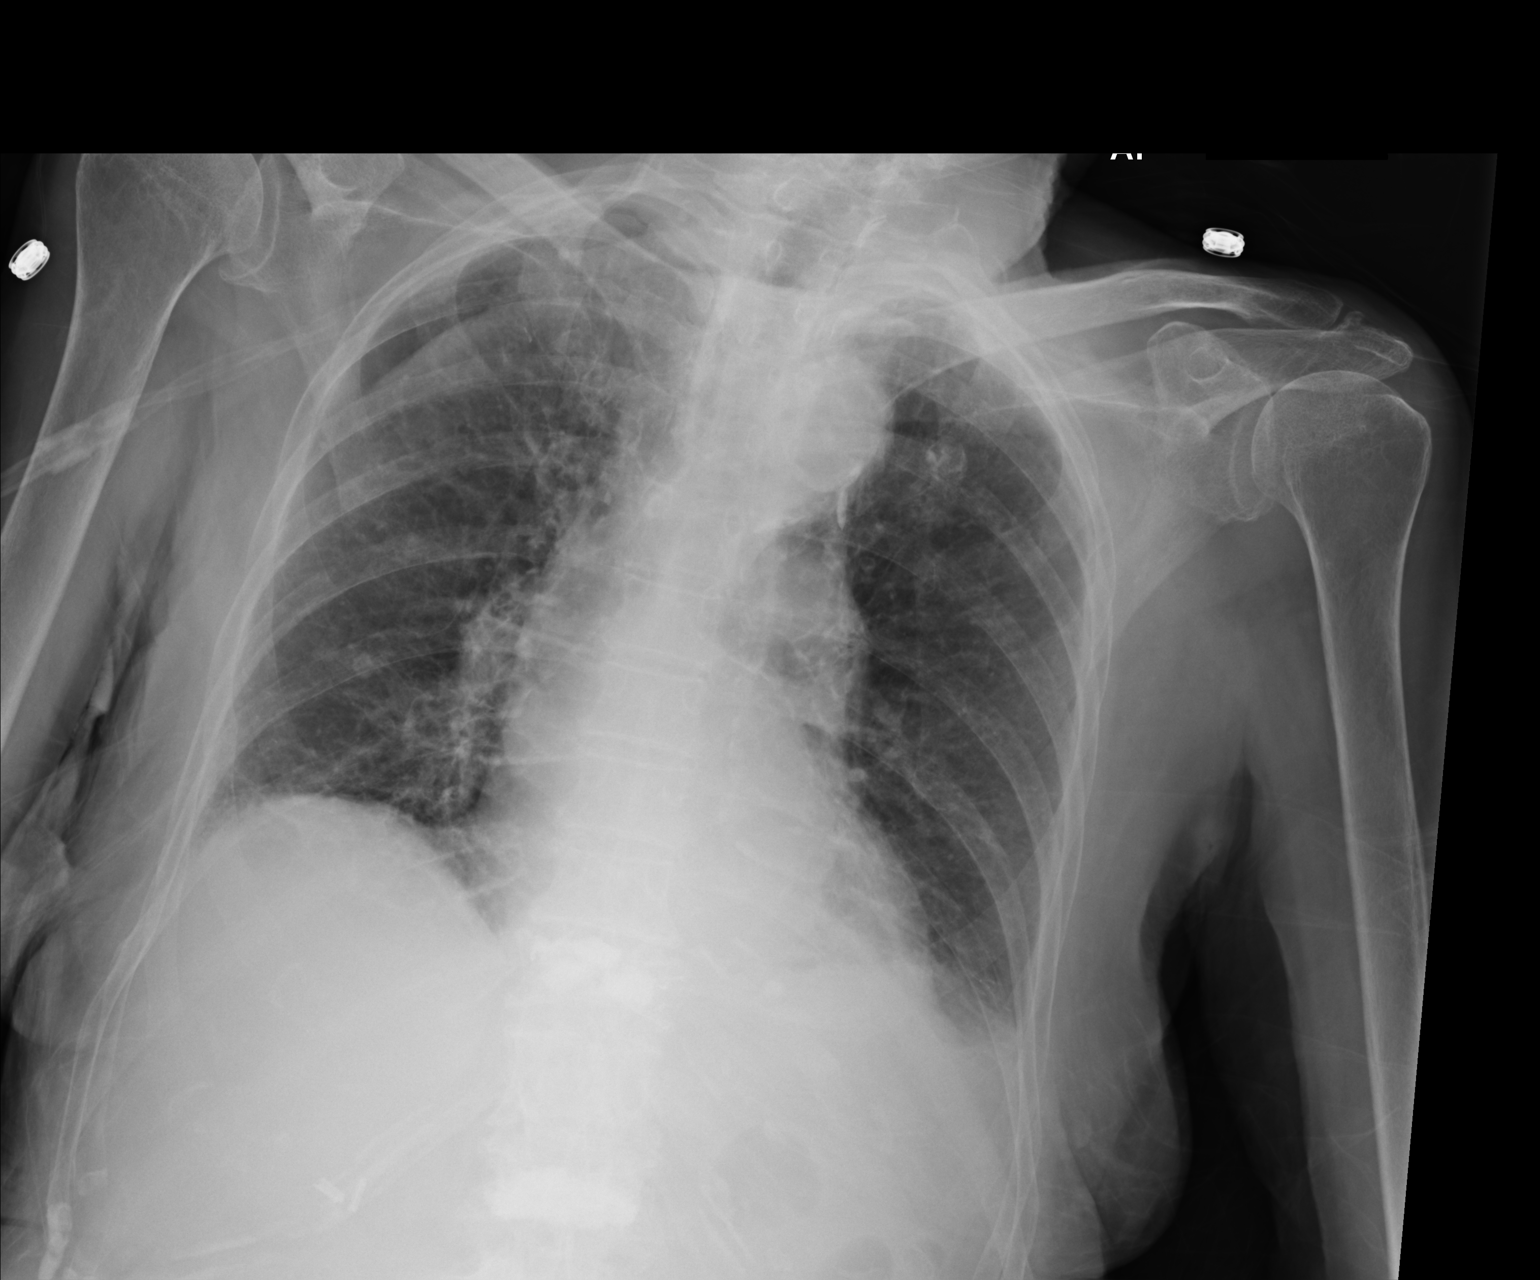

[1 of 1 positions shown; findings below may reference images not displayed]

FINDINGS: Heart size and vascular pattern are normal. Right lung is clear.
There is mild hazy opacity at the left base which was not present
previously.
IMPRESSION: Development of mild opacity at the left base which could represent
atelectasis or pneumonia.
